# Patient Record
Sex: Male | Born: 1985 | Race: White | Hispanic: No | Marital: Married | Smoking: Never smoker
Health system: Southern US, Community
[De-identification: ages and names within clinical notes are randomized; demographics above are authoritative.]

## PROBLEM LIST (undated history)

## (undated) DIAGNOSIS — R112 Nausea with vomiting, unspecified: Secondary | ICD-10-CM

## (undated) DIAGNOSIS — G8929 Other chronic pain: Secondary | ICD-10-CM

## (undated) DIAGNOSIS — Z9889 Other specified postprocedural states: Secondary | ICD-10-CM

## (undated) DIAGNOSIS — I499 Cardiac arrhythmia, unspecified: Secondary | ICD-10-CM

## (undated) DIAGNOSIS — M549 Dorsalgia, unspecified: Secondary | ICD-10-CM

## (undated) HISTORY — PX: HERNIA REPAIR: SHX51

## (undated) HISTORY — PX: KNEE SURGERY: SHX244

---

## 2006-08-04 ENCOUNTER — Emergency Department: Payer: Self-pay | Admitting: Unknown Physician Specialty

## 2007-05-11 ENCOUNTER — Ambulatory Visit: Payer: Self-pay | Admitting: Emergency Medicine

## 2008-07-18 ENCOUNTER — Ambulatory Visit: Payer: Self-pay | Admitting: Family Medicine

## 2009-03-28 ENCOUNTER — Ambulatory Visit: Payer: Self-pay | Admitting: Family Medicine

## 2009-07-19 ENCOUNTER — Emergency Department: Payer: Self-pay | Admitting: Emergency Medicine

## 2009-07-22 ENCOUNTER — Ambulatory Visit: Payer: Self-pay | Admitting: Internal Medicine

## 2010-03-09 ENCOUNTER — Ambulatory Visit: Payer: Self-pay | Admitting: Internal Medicine

## 2011-10-29 ENCOUNTER — Encounter (HOSPITAL_COMMUNITY): Payer: Self-pay | Admitting: *Deleted

## 2011-10-29 ENCOUNTER — Emergency Department (HOSPITAL_COMMUNITY)
Admission: EM | Admit: 2011-10-29 | Discharge: 2011-10-29 | Disposition: A | Discharge status: Home / Self Care | Payer: BC Managed Care – PPO | Attending: Emergency Medicine | Admitting: Emergency Medicine

## 2011-10-29 ENCOUNTER — Emergency Department (HOSPITAL_COMMUNITY): Payer: BC Managed Care – PPO

## 2011-10-29 DIAGNOSIS — S0093XA Contusion of unspecified part of head, initial encounter: Secondary | ICD-10-CM

## 2011-10-29 DIAGNOSIS — S0003XA Contusion of scalp, initial encounter: Secondary | ICD-10-CM | POA: Insufficient documentation

## 2011-10-29 DIAGNOSIS — IMO0002 Reserved for concepts with insufficient information to code with codable children: Secondary | ICD-10-CM | POA: Insufficient documentation

## 2011-10-29 DIAGNOSIS — R51 Headache: Secondary | ICD-10-CM | POA: Insufficient documentation

## 2011-10-29 HISTORY — DX: Cardiac arrhythmia, unspecified: I49.9

## 2011-10-29 NOTE — ED Notes (Signed)
Pt was working with front end loader , sapling tree struck him in  Lt side  of head, No LOC, but says everything "went dark".  No nausea, or confusion.  Alert, at triage, denies neck pain,

## 2011-10-29 NOTE — ED Provider Notes (Cosign Needed)
History   This chart was scribed for Ward Givens, MD by Sofie Rower. The patient was seen in room APA01/APA01 and the patient's care was started at 7:49 PM     CSN: [id number]  Arrival date & time 10/29/11  1914   First MD Initiated Contact with Patient 10/29/11 1928      Chief Complaint  Patient presents with  . Head Injury    (Consider location/radiation/quality/duration/timing/severity/associated sxs/prior treatment) HPI  Michael Jenkins is a 26 y.o. male who presents to the Emergency Department complaining of head injury onset today with associated symptoms of headache. The pt was pushing trees with his front end loader, when one of the trees sprung back and hit him in the head on the left side. This happened about 3:30 pm. States he has headache at the site.   Pt denies LOC, nausea, vomiting, blurred vision, numbness in the arms or legs, difficulty walking  Pt does not have a PCP. Cardiologist is Dr. Darrold Junker at [facility] in [place], [place].    Past Medical History  Diagnosis Date  . Arrhythmia   Pt has a hx of arrhythmia, taking metoprolol.   Past Surgical History  Procedure Date  . Hernia repair       History  Substance Use Topics  . Smoking status: Never Smoker   . Smokeless tobacco: Not on file  . Alcohol Use: No   Pt does not smoke, does not drink.  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    10 Systems reviewed and all are negative for acute change except as noted in the HPI.    Allergies  Medrol  Home Medications   Current Outpatient Rx  Name Route Sig Dispense Refill  . LORATADINE 10 MG PO TABS Oral Take 10 mg by mouth daily.    Marland Kitchen METOPROLOL SUCCINATE ER 25 MG PO TB24 Oral Take 25 mg by mouth every evening.      BP 124/73  Pulse 73  Temp 98.3 F (36.8 C) (Oral)  Resp 20  Ht 5\' 7"  (1.702 m)  Wt 175 lb (79.379 kg)  BMI 27.41 kg/m2  SpO2 98%  Vital signs  normal    Physical Exam  Nursing note and vitals reviewed. Constitutional: He is oriented to person, place, and time. He appears well-developed and well-nourished.  HENT:  Head: Normocephalic.  Right Ear: External ear normal.  Left Ear: Tympanic membrane, external ear and ear canal normal.  Nose: Nose normal.  Mouth/Throat: Oropharynx is clear and moist.       Swelling, bruising just above left ear on the scalp, linear 6 cm X 2 cm, no laceration or abrasion   Eyes: Conjunctivae and EOM are normal. Right eye exhibits no discharge. Left eye exhibits no discharge.  Pulmonary/Chest: Effort normal. No respiratory distress.  Musculoskeletal: Normal range of motion. He exhibits no edema and no tenderness.  Neurological: He is alert and oriented to person, place, and time.  Skin: Skin is warm and dry. No rash noted.  Psychiatric: He has a normal mood and affect. His behavior is normal.    ED Course  Procedures (including critical care time)  DIAGNOSTIC STUDIES: Oxygen Saturation is 98% on room air, normal by my interpretation.    COORDINATION OF CARE:  7:52PM- EDP at bedside discusses treatment plan.   Pt remains neurologically intact in the ED   Ct Head Wo Contrast  10/29/2011  *RADIOLOGY  REPORT*  Clinical Data:  Trauma, head injury, headache  CT HEAD WITHOUT CONTRAST  Technique:  Contiguous axial images were obtained from the base of the skull through the vertex without contrast  Comparison:  None.  Findings:  The brain has a normal appearance without evidence for hemorrhage, acute infarction, hydrocephalus, or mass lesion.  There is no extra axial fluid collection.  The skull and paranasal sinuses are normal.  IMPRESSION: Normal CT of the head without contrast.  Original Report Authenticated By: Judie Petit. Ruel Favors, M.D.      1. Contusion of head     Plan discharge  Devoria Albe, MD, FACEP    MDM   I personally performed the services described in this documentation, which was  scribed in my presence. The recorded information has been reviewed and considered.  Devoria Albe, MD, FACEP    Ward Givens, MD 10/30/11 0005

## 2011-10-29 NOTE — Discharge Instructions (Signed)
Ice packs to the swollen area. You can safely take tylenol for pain 1000 mg every 6hrs. Return to the ED for any problems listed on the head injury sheet.

## 2012-06-10 ENCOUNTER — Ambulatory Visit: Payer: Self-pay | Admitting: Emergency Medicine

## 2012-06-10 LAB — RAPID STREP-A WITH REFLX: Micro Text Report: NEGATIVE

## 2013-07-30 ENCOUNTER — Ambulatory Visit: Payer: Self-pay | Admitting: Emergency Medicine

## 2013-08-05 ENCOUNTER — Emergency Department: Payer: Self-pay

## 2013-08-05 LAB — CBC WITH DIFFERENTIAL/PLATELET
Basophil #: 0.1 10*3/uL (ref 0.0–0.1)
Basophil %: 0.9 %
EOS PCT: 0.2 %
Eosinophil #: 0 10*3/uL (ref 0.0–0.7)
HCT: 51.8 % (ref 40.0–52.0)
HGB: 16.8 g/dL (ref 13.0–18.0)
Lymphocyte #: 1.6 10*3/uL (ref 1.0–3.6)
Lymphocyte %: 18 %
MCH: 28.8 pg (ref 26.0–34.0)
MCHC: 32.5 g/dL (ref 32.0–36.0)
MCV: 89 fL (ref 80–100)
Monocyte #: 0.5 x10 3/mm (ref 0.2–1.0)
Monocyte %: 6.1 %
NEUTROS ABS: 6.5 10*3/uL (ref 1.4–6.5)
Neutrophil %: 74.8 %
PLATELETS: 218 10*3/uL (ref 150–440)
RBC: 5.85 10*6/uL (ref 4.40–5.90)
RDW: 13.1 % (ref 11.5–14.5)
WBC: 8.7 10*3/uL (ref 3.8–10.6)

## 2013-08-05 LAB — BASIC METABOLIC PANEL
Anion Gap: 4 — ABNORMAL LOW (ref 7–16)
BUN: 12 mg/dL (ref 7–18)
CHLORIDE: 106 mmol/L (ref 98–107)
CO2: 29 mmol/L (ref 21–32)
Calcium, Total: 9.3 mg/dL (ref 8.5–10.1)
Creatinine: 1.04 mg/dL (ref 0.60–1.30)
EGFR (African American): >60
EGFR (Non-African Amer.): >60
GLUCOSE: 102 mg/dL — AB (ref 65–99)
OSMOLALITY: 277 (ref 275–301)
Potassium: 4.1 mmol/L (ref 3.5–5.1)
Sodium: 139 mmol/L (ref 136–145)

## 2013-08-05 LAB — TROPONIN I: Troponin-I: <0.02 ng/mL

## 2014-01-15 ENCOUNTER — Emergency Department (HOSPITAL_COMMUNITY)
Admission: EM | Admit: 2014-01-15 | Discharge: 2014-01-16 | Disposition: A | Discharge status: Home / Self Care | Payer: BC Managed Care – PPO | Attending: Emergency Medicine | Admitting: Emergency Medicine

## 2014-01-15 ENCOUNTER — Encounter (HOSPITAL_COMMUNITY): Payer: Self-pay | Admitting: Emergency Medicine

## 2014-01-15 DIAGNOSIS — K5289 Other specified noninfective gastroenteritis and colitis: Secondary | ICD-10-CM | POA: Insufficient documentation

## 2014-01-15 DIAGNOSIS — R112 Nausea with vomiting, unspecified: Secondary | ICD-10-CM | POA: Insufficient documentation

## 2014-01-15 DIAGNOSIS — K529 Noninfective gastroenteritis and colitis, unspecified: Secondary | ICD-10-CM

## 2014-01-15 DIAGNOSIS — Z79899 Other long term (current) drug therapy: Secondary | ICD-10-CM | POA: Diagnosis not present

## 2014-01-15 DIAGNOSIS — R197 Diarrhea, unspecified: Secondary | ICD-10-CM | POA: Insufficient documentation

## 2014-01-15 DIAGNOSIS — R109 Unspecified abdominal pain: Secondary | ICD-10-CM | POA: Diagnosis not present

## 2014-01-15 LAB — BASIC METABOLIC PANEL
Anion gap: 16 — ABNORMAL HIGH (ref 5–15)
BUN: 15 mg/dL (ref 6–23)
CALCIUM: 9.5 mg/dL (ref 8.4–10.5)
CO2: 24 mEq/L (ref 19–32)
CREATININE: 0.97 mg/dL (ref 0.50–1.35)
Chloride: 100 mEq/L (ref 96–112)
GFR calc Af Amer: >90 mL/min (ref >90)
GLUCOSE: 113 mg/dL — AB (ref 70–99)
POTASSIUM: 3.8 meq/L (ref 3.7–5.3)
Sodium: 140 mEq/L (ref 137–147)

## 2014-01-15 LAB — CBC WITH DIFFERENTIAL/PLATELET
Basophils Absolute: 0 10*3/uL (ref 0.0–0.1)
Basophils Relative: 0 % (ref 0–1)
EOS ABS: 0 10*3/uL (ref 0.0–0.7)
EOS PCT: 0 % (ref 0–5)
HEMATOCRIT: 49.9 % (ref 39.0–52.0)
HEMOGLOBIN: 17.4 g/dL — AB (ref 13.0–17.0)
LYMPHS ABS: 0.6 10*3/uL — AB (ref 0.7–4.0)
Lymphocytes Relative: 7 % — ABNORMAL LOW (ref 12–46)
MCH: 30.2 pg (ref 26.0–34.0)
MCHC: 34.9 g/dL (ref 30.0–36.0)
MCV: 86.6 fL (ref 78.0–100.0)
MONO ABS: 0.6 10*3/uL (ref 0.1–1.0)
MONOS PCT: 7 % (ref 3–12)
Neutro Abs: 6.9 10*3/uL (ref 1.7–7.7)
Neutrophils Relative %: 86 % — ABNORMAL HIGH (ref 43–77)
Platelets: 180 10*3/uL (ref 150–400)
RBC: 5.76 MIL/uL (ref 4.22–5.81)
RDW: 12.4 % (ref 11.5–15.5)
WBC: 8.1 10*3/uL (ref 4.0–10.5)

## 2014-01-15 MED ORDER — SODIUM CHLORIDE 0.9 % IV BOLUS (SEPSIS)
1000.0000 mL | Freq: Once | INTRAVENOUS | Status: AC
  Administered 2014-01-15: 1000 mL via INTRAVENOUS

## 2014-01-15 MED ORDER — ONDANSETRON HCL 4 MG/2ML IJ SOLN
4.0000 mg | Freq: Once | INTRAMUSCULAR | Status: AC
  Administered 2014-01-15: 4 mg via INTRAVENOUS
  Filled 2014-01-15: qty 2

## 2014-01-15 NOTE — ED Notes (Addendum)
Pt states he ate deer meat yesterday and has been having n/v/d ever since. Pt states he feels weak and his legs feel numb. Pt his back hurts from vomiting and his chest has been hurting since 2 pm today. Pt states it has been constant and dull.

## 2014-01-15 NOTE — ED Provider Notes (Signed)
CSN: [id number]     Arrival date & time 01/15/14  1931 History   First MD Initiated Contact with Patient 01/15/14 2205   This chart was scribed for Benny Lennert, MD by Gwenevere Abbot, ED scribe. This patient was seen in room APA17/APA17 and the patient's care was started at 10:07 PM.    Chief Complaint  Patient presents with  . Emesis   Patient is a 28 y.o. male presenting with vomiting. The history is provided by the patient. No language interpreter was used.  Emesis Severity:  Moderate Associated symptoms: abdominal pain and diarrhea   Associated symptoms: no headaches    HPI Comments:  Michael Jenkins is a 28 y.o. male who presents to the Emergency Department complaining of emesis. Pt reports that he has had approximately 7 episodes of emesis, with associated symptoms abdominal cramping and diarrhea, onset yesterday. Pt reports that he ate deer meat last night, that was mixed with beef. Pt reports that he had similar symptoms in February of this year after consuming the same food.   Past Medical History  Diagnosis Date  . Arrhythmia    Past Surgical History  Procedure Laterality Date  . Hernia repair     History reviewed. No pertinent family history. History  Substance Use Topics  . Smoking status: Never Smoker   . Smokeless tobacco: Not on file  . Alcohol Use: No    Review of Systems  Constitutional: Negative for appetite change and fatigue.  HENT: Negative for congestion, ear discharge and sinus pressure.   Eyes: Negative for discharge.  Respiratory: Negative for cough.   Cardiovascular: Negative for chest pain.  Gastrointestinal: Positive for nausea, vomiting, abdominal pain and diarrhea.  Genitourinary: Negative for frequency and hematuria.  Musculoskeletal: Negative for back pain.  Skin: Negative for rash.  Neurological: Negative for seizures and headaches.  Psychiatric/Behavioral: Negative for hallucinations.      Allergies  Medrol  Home Medications    Prior to Admission medications   Medication Sig Start Date End Date Taking? Authorizing Provider  metoprolol succinate (TOPROL-XL) 25 MG 24 hr tablet Take 25 mg by mouth every evening.   Yes Historical Provider, MD  loratadine (CLARITIN) 10 MG tablet Take 10 mg by mouth daily.    Historical Provider, MD   BP 116/69  Pulse 88  Temp(Src) 99.2 F (37.3 C) (Oral)  Resp 17  Ht  (1.702 m)  Wt 181 lb (82.101 kg)  BMI 28.34 kg/m2  SpO2 96% Physical Exam  Constitutional: He is oriented to person, place, and time. He appears well-developed.  HENT:  Head: Normocephalic.  Eyes: Conjunctivae and EOM are normal. No scleral icterus.  Neck: Neck supple. No thyromegaly present.  Cardiovascular: Normal rate and regular rhythm.  Exam reveals no gallop and no friction rub.   No murmur heard. Pulmonary/Chest: No stridor. He has no wheezes. He has no rales. He exhibits no tenderness.  Abdominal: He exhibits no distension. There is generalized tenderness. There is no rebound.  Musculoskeletal: Normal range of motion. He exhibits no edema.  Lymphadenopathy:    He has no cervical adenopathy.  Neurological: He is oriented to person, place, and time. He exhibits normal muscle tone. Coordination normal.  Skin: No rash noted. No erythema.  Psychiatric: He has a normal mood and affect. His behavior is normal.    ED Course  Procedures  DIAGNOSTIC STUDIES: Oxygen Saturation is 100% on RA, normal by my interpretation.  COORDINATION OF CARE: 10:10 PM-Discussed treatment plan  with pt  Labs Review Labs Reviewed  CBC WITH DIFFERENTIAL - Abnormal; Notable for the following:    Hemoglobin 17.4 (*)    Neutrophils Relative % 86 (*)    Lymphocytes Relative 7 (*)    Lymphs Abs 0.6 (*)    All other components within normal limits  BASIC METABOLIC PANEL - Abnormal; Notable for the following:    Glucose, Bld 113 (*)    Anion gap 16 (*)    All other components within normal limits    Imaging  Review No results found.   EKG Interpretation   Date/Time:  Friday January 15 2014 19:48:14 EDT Ventricular Rate:  97 PR Interval:  152 QRS Duration: 88 QT Interval:  334 QTC Calculation: 424 R Axis:   14 Text Interpretation:  Normal sinus rhythm Normal ECG Confirmed by POLLINA   MD, CHRISTOPHER [phone]) on 01/15/2014 8:54:39 PM      MDM   Final diagnoses:  None    Gastroenteritis  The chart was scribed for me under my direct supervision.  I personally performed the history, physical, and medical decision making and all procedures in the evaluation of this patient.Benny Lennert, MD 01/16/14 [phone]

## 2014-01-15 NOTE — ED Notes (Signed)
Patient states NVD  Since 0200 this morning. Patient AOX4 at this time.

## 2014-01-16 MED ORDER — ONDANSETRON 4 MG PO TBDP: ORAL_TABLET | ORAL | Status: DC

## 2014-01-16 NOTE — Discharge Instructions (Signed)
Tylenol or motrin for pain.  Plenty of fluids.  Follow up as needed

## 2014-09-23 ENCOUNTER — Other Ambulatory Visit: Payer: Self-pay | Admitting: Orthopedic Surgery

## 2014-09-23 DIAGNOSIS — M25561 Pain in right knee: Secondary | ICD-10-CM

## 2014-09-30 ENCOUNTER — Ambulatory Visit
Admission: RE | Admit: 2014-09-30 | Discharge: 2014-09-30 | Disposition: A | Discharge status: Home / Self Care | Payer: BLUE CROSS/BLUE SHIELD | Source: Ambulatory Visit | Attending: Orthopedic Surgery | Admitting: Orthopedic Surgery

## 2014-09-30 DIAGNOSIS — S83241A Other tear of medial meniscus, current injury, right knee, initial encounter: Secondary | ICD-10-CM | POA: Insufficient documentation

## 2014-09-30 DIAGNOSIS — M25561 Pain in right knee: Secondary | ICD-10-CM

## 2014-09-30 DIAGNOSIS — X58XXXA Exposure to other specified factors, initial encounter: Secondary | ICD-10-CM | POA: Diagnosis not present

## 2015-02-11 ENCOUNTER — Encounter: Payer: Self-pay | Admitting: Emergency Medicine

## 2015-02-11 ENCOUNTER — Emergency Department
Admission: EM | Admit: 2015-02-11 | Discharge: 2015-02-11 | Disposition: A | Discharge status: Home / Self Care | Payer: BLUE CROSS/BLUE SHIELD | Attending: Emergency Medicine | Admitting: Emergency Medicine

## 2015-02-11 DIAGNOSIS — Z79899 Other long term (current) drug therapy: Secondary | ICD-10-CM | POA: Diagnosis not present

## 2015-02-11 DIAGNOSIS — K644 Residual hemorrhoidal skin tags: Secondary | ICD-10-CM | POA: Insufficient documentation

## 2015-02-11 DIAGNOSIS — K625 Hemorrhage of anus and rectum: Secondary | ICD-10-CM | POA: Diagnosis present

## 2015-02-11 LAB — BASIC METABOLIC PANEL
Anion gap: 8 (ref 5–15)
BUN: 16 mg/dL (ref 6–20)
CHLORIDE: 105 mmol/L (ref 101–111)
CO2: 25 mmol/L (ref 22–32)
Calcium: 9 mg/dL (ref 8.9–10.3)
Creatinine, Ser: 1.1 mg/dL (ref 0.61–1.24)
GFR calc Af Amer: >60 mL/min (ref >60)
GFR calc non Af Amer: >60 mL/min (ref >60)
GLUCOSE: 144 mg/dL — AB (ref 65–99)
POTASSIUM: 3.7 mmol/L (ref 3.5–5.1)
Sodium: 138 mmol/L (ref 135–145)

## 2015-02-11 LAB — CBC WITH DIFFERENTIAL/PLATELET
Basophils Absolute: 0 10*3/uL (ref 0–0.1)
Basophils Relative: 1 %
EOS PCT: 1 %
Eosinophils Absolute: 0 10*3/uL (ref 0–0.7)
HCT: 47.3 % (ref 40.0–52.0)
HEMOGLOBIN: 16 g/dL (ref 13.0–18.0)
LYMPHS ABS: 1.4 10*3/uL (ref 1.0–3.6)
LYMPHS PCT: 30 %
MCH: 29.5 pg (ref 26.0–34.0)
MCHC: 33.8 g/dL (ref 32.0–36.0)
MCV: 87.3 fL (ref 80.0–100.0)
MONOS PCT: 10 %
Monocytes Absolute: 0.5 10*3/uL (ref 0.2–1.0)
Neutro Abs: 2.7 10*3/uL (ref 1.4–6.5)
Neutrophils Relative %: 58 %
PLATELETS: 172 10*3/uL (ref 150–440)
RBC: 5.42 MIL/uL (ref 4.40–5.90)
RDW: 12.8 % (ref 11.5–14.5)
WBC: 4.6 10*3/uL (ref 3.8–10.6)

## 2015-02-11 MED ORDER — HYDROCORTISONE 2.5 % RE CREA: TOPICAL_CREAM | RECTAL | Status: AC

## 2015-02-11 NOTE — ED Notes (Signed)
Pt to ed with c/o bloody stool x 1 this am and 2 days ago.  Denies abd pain, denies nausea, denies vomiting.

## 2015-02-11 NOTE — ED Provider Notes (Signed)
Michael Jenkins: [id number]     Arrival date & time 02/11/15  1252 History   First MD Initiated Contact with Patient 02/11/15 1257     Chief Complaint  Patient presents with  . Rectal Bleeding     (Consider location/radiation/quality/duration/timing/severity/associated sxs/prior Treatment) The history is provided by the patient.  Michael Jenkins is a 29 y.o. male here presenting with blood around his stool. States that he had let around his stool about 2 days ago. States that the stool appears normal but has blood around the stool. Denies being constipated. Denies any fever or vomiting or abdominal pain. Denies anal sex. Sexually active with his wife only. Denies urinary symptoms but does notice small bump on his penis but no urethral discharge.    Past Medical History  Diagnosis Date  . Arrhythmia    Past Surgical History  Procedure Laterality Date  . Hernia repair     History reviewed. No pertinent family history. Social History  Substance Use Topics  . Smoking status: Never Smoker   . Smokeless tobacco: None  . Alcohol Use: Yes    Review of Systems  Gastrointestinal: Positive for blood in stool and hematochezia.  All other systems reviewed and are negative.     Allergies  Medrol  Home Medications   Prior to Admission medications   Medication Sig Start Date End Date Taking? Authorizing Provider  hydrocortisone (ANUSOL-HC) 2.5 % rectal cream Apply rectally 2 times daily 02/11/15 02/11/16  Richardean Canal, MD  loratadine (CLARITIN) 10 MG tablet Take 10 mg by mouth daily.    Historical Provider, MD  metoprolol succinate (TOPROL-XL) 25 MG 24 hr tablet Take 25 mg by mouth every evening.    Historical Provider, MD  ondansetron (ZOFRAN ODT) 4 MG disintegrating tablet  ODT q4 hours prn nausea/vomit 01/16/14   Bethann Berkshire, MD   BP 149/92 mmHg  Pulse 76  Temp(Src) 97.7 F (36.5 C) (Oral)  Resp 20  Ht  (1.702 m)  Wt 180 lb (81.647 kg)  BMI 28.19 kg/m2  SpO2 98% Physical Exam   Constitutional: He is oriented to person, place, and time. He appears well-developed.  HENT:  Head: Normocephalic.  Mouth/Throat: Oropharynx is clear and moist.  Eyes: Conjunctivae are normal. Pupils are equal, round, and reactive to light.  Neck: Normal range of motion. Neck supple.  Cardiovascular: Normal rate, regular rhythm and normal heart sounds.   Pulmonary/Chest: Effort normal and breath sounds normal. No respiratory distress. He has no wheezes. He has no rales.  Abdominal: Soft. Bowel sounds are normal. He exhibits no distension. There is no tenderness. There is no rebound.  Genitourinary:  Small vesicle on penis, no penile discharge. Testicles nontender and no obvious mass. Rectal- small external hemorrhoid that is not thrombosed. Brown stool with blood around it, faintly guiac positive.   Neurological: He is alert and oriented to person, place, and time.  Skin: Skin is warm and dry.  Psychiatric: He has a normal mood and affect. His behavior is normal. Judgment and thought content normal.  Nursing note and vitals reviewed.   ED Course  Procedures (including critical care time) Labs Review Labs Reviewed  BASIC METABOLIC PANEL - Abnormal; Notable for the following:    Glucose, Bld 144 (*)    All other components within normal limits  CBC WITH DIFFERENTIAL/PLATELET    Imaging Review No results found. I have personally reviewed and evaluated these images and lab results as part of my medical decision-making.   EKG  Interpretation None      MDM   Final diagnoses:  Hemorrhoids, external    Michael Jenkins is a 29 y.o. male here with blood around stool likely external hemorrhoid. Hemorrhoid doesn't appear thrombosed. Small penile lesion, sexually active with only wife and no hx of STDs. No urinary symptoms. Will dc home with anusol cream.     Richardean Canal, MD 02/11/15 [phone]

## 2015-02-11 NOTE — Discharge Instructions (Signed)
Try anusol cream twice daily.  Use sitz bath (warm water) 2-3 times daily.  Avoid sitting too long or picking up heavy things.   See your doctor.   Return to ER if you have uncontrolled bleeding, severe pain, fever, vomiting.    Hemorrhoids Hemorrhoids are puffy (swollen) veins around the rectum or anus. Hemorrhoids can cause pain, itching, bleeding, or irritation. HOME CARE  Eat foods with fiber, such as whole grains, beans, nuts, fruits, and vegetables. Ask your doctor about taking products with added fiber in them (fibersupplements).  Drink enough fluid to keep your pee (urine) clear or pale yellow.  Exercise often.  Go to the bathroom when you have the urge to poop. Do not wait.  Avoid straining to poop (bowel movement).  Keep the butt area dry and clean. Use wet toilet paper or moist paper towels.  Medicated creams and medicine inserted into the anus (anal suppository) may be used or applied as told.  Only take medicine as told by your doctor.  Take a warm water bath (sitz bath) for 15-20 minutes to ease pain. Do this 3-4 times a day.  Place ice packs on the area if it is tender or puffy. Use the ice packs between the warm water baths.  Put ice in a plastic bag.  Place a towel between your skin and the bag.  Leave the ice on for 15-20 minutes, 03-04 times a day.  Do not use a donut-shaped pillow or sit on the toilet for a long time. GET HELP RIGHT AWAY IF:   You have more pain that is not controlled by treatment or medicine.  You have bleeding that will not stop.  You have trouble or are unable to poop (bowel movement).  You have pain or puffiness outside the area of the hemorrhoids. MAKE SURE YOU:   Understand these instructions.  Will watch your condition.  Will get help right away if you are not doing well or get worse.   This information is not intended to replace advice given to you by your health care provider. Make sure you discuss any  questions you have with your health care provider.   Document Released: 01/31/2008 Document Revised: 04/09/2012 Document Reviewed: 03/04/2012 Elsevier Interactive Patient Education [employer]

## 2015-02-14 ENCOUNTER — Encounter: Payer: Self-pay | Admitting: *Deleted

## 2015-02-14 ENCOUNTER — Ambulatory Visit
Admission: EM | Admit: 2015-02-14 | Discharge: 2015-02-14 | Disposition: A | Discharge status: Home / Self Care | Payer: BLUE CROSS/BLUE SHIELD | Attending: Family Medicine | Admitting: Family Medicine

## 2015-02-14 DIAGNOSIS — N489 Disorder of penis, unspecified: Secondary | ICD-10-CM

## 2015-02-14 DIAGNOSIS — N4889 Other specified disorders of penis: Secondary | ICD-10-CM

## 2015-02-14 MED ORDER — MUPIROCIN 2 % EX OINT: 1.0000 "application " | TOPICAL_OINTMENT | Freq: Three times a day (TID) | CUTANEOUS | Status: DC

## 2015-02-14 NOTE — ED Notes (Signed)
Patient started having symptoms of a hard spot growing on the left side of his penis about 1 month ago. Patient initially thought it was a insect bite from putting up hay. No drainage has been observed. The hard spot is raised and red with soreness.

## 2015-02-14 NOTE — ED Provider Notes (Signed)
CSN: [id number]     Arrival date & time 02/14/15  1032 History   First MD Initiated Contact with Patient 02/14/15 1220    Nurses notes were reviewed.  Chief Complaint  Patient presents with  . Groin Swelling   (Consider location/radiation/quality/duration/timing/severity/associated sxs/prior Treatment) Patient is a 29 y.o. male presenting with testicular pain. The history is provided by the patient. No language interpreter was used.  Testicle Pain This is a new (Penile pain not testicle pain) problem. The current episode started more than 1 week ago. The problem occurs constantly. The symptoms are aggravated by intercourse. Nothing relieves the symptoms. The treatment provided no relief.   Patient's here because of basically a nodule on the left shaft of his penis area and states several weeks ago he noticed a small knot. On the left shaft. The knot has grown has become more more irritated as it has gotten larger. Initially states that it did not stop him from having sexual relations and it still doesn't hamper him from having sexual relations but he was concerned because of the location and what this lesion could be. He denies sexual activity outside of his marriage and reports other when he was 17 his wife is only one person he's been sexually active with.   Past Medical History  Diagnosis Date  . Arrhythmia    Past Surgical History  Procedure Laterality Date  . Hernia repair     History reviewed. No pertinent family history. Social History  Substance Use Topics  . Smoking status: Never Smoker   . Smokeless tobacco: Never Used  . Alcohol Use: Yes    Review of Systems  Genitourinary: Positive for testicular pain.  All other systems reviewed and are negative.   Allergies  Medrol  Home Medications   Prior to Admission medications   Medication Sig Start Date End Date Taking? Authorizing Provider  loratadine (CLARITIN) 10 MG tablet Take 10 mg by mouth daily.   Yes Historical  Provider, MD  hydrocortisone (ANUSOL-HC) 2.5 % rectal cream Apply rectally 2 times daily 02/11/15 02/11/16  Richardean Canal, MD  metoprolol succinate (TOPROL-XL) 25 MG 24 hr tablet Take 25 mg by mouth every evening.    Historical Provider, MD  mupirocin ointment (BACTROBAN) 2 % Apply 1 application topically 3 (three) times daily. 02/14/15   Hassan Rowan, MD  ondansetron (ZOFRAN ODT) 4 MG disintegrating tablet  ODT q4 hours prn nausea/vomit 01/16/14   Bethann Berkshire, MD   Meds Ordered and Administered this Visit  Medications - No data to display  BP 122/79 mmHg  Pulse 67  Temp(Src) 98.1 F (36.7 C) (Oral)  Resp 18  Ht  (1.702 m)  Wt 185 lb (83.915 kg)  BMI 28.97 kg/m2  SpO2 100% No data found.   Physical Exam  Constitutional: He is oriented to person, place, and time. He appears well-developed and well-nourished.  HENT:  Head: Normocephalic and atraumatic.  Eyes: Pupils are equal, round, and reactive to light.  Neck: Normal range of motion. Neck supple.  Genitourinary: Testes normal.    Circumcised.  Small single nodule on the shaft of the penis on the left. The nodule looks irritated and red. No ulcerations present  Musculoskeletal: Normal range of motion. He exhibits no edema.  Neurological: He is alert and oriented to person, place, and time.  Skin: Skin is warm and dry.  Psychiatric: He has a normal mood and affect. His behavior is normal.  Vitals reviewed.   ED Course  Procedures (including critical care time)  Labs Review Labs Reviewed  RPR    Imaging Review No results found.   Visual Acuity Review  Right Eye Distance:   Left Eye Distance:   Bilateral Distance:    Right Eye Near:   Left Eye Near:    Bilateral Near:         MDM   1. Penile lesion     Explained to the patient I think this is a benign lesion. I think is not an STD or anything transmittable. Does not like again to wart or a symphysis granuloma. But unfortunately because of this  area receiving friction movement times this is probably caused the nodule to become irritated and grow. We'll get an RPR just to make sure the symphysis is not a factor will also place him back pain ointment 2-3 times a day to see if this makes it shrinker go away and in the meantime will obtain a urological consultation for evaluation and possible excision in 3-4 weeks if it's not shrinking. Patient seems to improve with this affects her have knee surgery in about 2-3 weeks so he stuck be able to go to work and elevated time for him to have his appointment preterm at the nodule is thinking of going away he can cancel the urological visit.  Hassan Rowan, MD 02/14/15 [phone]

## 2015-02-14 NOTE — Discharge Instructions (Signed)
Please apply Bactroban ointment to 3 times a day for the next 3-4 weeks. If the lesion is reducing in size then he may cancel the urological referral. But if it's not pleased to see to see the urologist for further evaluation and possible biopsy and removal of the lesion.

## 2015-02-15 LAB — RPR: RPR: NONREACTIVE

## 2015-04-23 ENCOUNTER — Emergency Department
Admission: EM | Admit: 2015-04-23 | Discharge: 2015-04-23 | Disposition: A | Discharge status: Home / Self Care | Payer: Worker's Compensation | Attending: Emergency Medicine | Admitting: Emergency Medicine

## 2015-04-23 ENCOUNTER — Encounter: Payer: Self-pay | Admitting: Emergency Medicine

## 2015-04-23 DIAGNOSIS — Z7721 Contact with and (suspected) exposure to potentially hazardous body fluids: Secondary | ICD-10-CM | POA: Diagnosis not present

## 2015-04-23 DIAGNOSIS — Z7952 Long term (current) use of systemic steroids: Secondary | ICD-10-CM | POA: Insufficient documentation

## 2015-04-23 DIAGNOSIS — Z792 Long term (current) use of antibiotics: Secondary | ICD-10-CM | POA: Insufficient documentation

## 2015-04-23 DIAGNOSIS — Z79899 Other long term (current) drug therapy: Secondary | ICD-10-CM | POA: Diagnosis not present

## 2015-04-23 HISTORY — DX: Cardiac arrhythmia, unspecified: I49.9

## 2015-04-23 LAB — CBC WITH DIFFERENTIAL/PLATELET
BASOS ABS: 0.2 10*3/uL — AB (ref 0–0.1)
BASOS PCT: 3 %
EOS ABS: 0.1 10*3/uL (ref 0–0.7)
Eosinophils Relative: 1 %
HEMATOCRIT: 50.1 % (ref 40.0–52.0)
HEMOGLOBIN: 16.6 g/dL (ref 13.0–18.0)
Lymphocytes Relative: 28 %
Lymphs Abs: 2 10*3/uL (ref 1.0–3.6)
MCH: 29.5 pg (ref 26.0–34.0)
MCHC: 33.1 g/dL (ref 32.0–36.0)
MCV: 89.2 fL (ref 80.0–100.0)
Monocytes Absolute: 0.4 10*3/uL (ref 0.2–1.0)
Monocytes Relative: 6 %
NEUTROS ABS: 4.5 10*3/uL (ref 1.4–6.5)
NEUTROS PCT: 62 %
Platelets: 196 10*3/uL (ref 150–440)
RBC: 5.61 MIL/uL (ref 4.40–5.90)
RDW: 12.7 % (ref 11.5–14.5)
WBC: 7.2 10*3/uL (ref 3.8–10.6)

## 2015-04-23 LAB — COMPREHENSIVE METABOLIC PANEL
ALK PHOS: 70 U/L (ref 38–126)
ALT: 104 U/L — AB (ref 17–63)
ANION GAP: 6 (ref 5–15)
AST: 41 U/L (ref 15–41)
Albumin: 5 g/dL (ref 3.5–5.0)
BUN: 12 mg/dL (ref 6–20)
CALCIUM: 9.6 mg/dL (ref 8.9–10.3)
CO2: 30 mmol/L (ref 22–32)
CREATININE: 0.93 mg/dL (ref 0.61–1.24)
Chloride: 105 mmol/L (ref 101–111)
Glucose, Bld: 91 mg/dL (ref 65–99)
Potassium: 4.1 mmol/L (ref 3.5–5.1)
SODIUM: 141 mmol/L (ref 135–145)
Total Bilirubin: 1 mg/dL (ref 0.3–1.2)
Total Protein: 7.5 g/dL (ref 6.5–8.1)

## 2015-04-23 MED ORDER — RALTEGRAVIR POTASSIUM 400 MG PO TABS
400.0000 mg | ORAL_TABLET | Freq: Two times a day (BID) | ORAL | Status: DC
  Administered 2015-04-23: 400 mg via ORAL
  Filled 2015-04-23: qty 1

## 2015-04-23 MED ORDER — EMTRICITABINE-TENOFOVIR DF 200-300 MG PO TABS
1.0000 | ORAL_TABLET | Freq: Every day | ORAL | Status: DC
  Administered 2015-04-23: 1 via ORAL
  Filled 2015-04-23: qty 1
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
## 2015-04-23 NOTE — ED Notes (Signed)
Spoke with Supervisor and they state pt does not need urine drug screen at this time.

## 2015-04-23 NOTE — ED Notes (Signed)
Medication dispensed from pharmacy for a 3 day supply. Pt instructed to come back to pharmacy on Tuesday for rest of medication supply. Pt instructed to call tomorrow or come back for results of other pt's HIV results.
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
## 2015-08-21 ENCOUNTER — Ambulatory Visit
Admission: EM | Admit: 2015-08-21 | Discharge: 2015-08-21 | Disposition: A | Discharge status: Home / Self Care | Payer: BLUE CROSS/BLUE SHIELD | Attending: Family Medicine | Admitting: Family Medicine

## 2015-08-21 ENCOUNTER — Encounter: Payer: Self-pay | Admitting: *Deleted

## 2015-08-21 DIAGNOSIS — J069 Acute upper respiratory infection, unspecified: Secondary | ICD-10-CM

## 2015-08-21 DIAGNOSIS — J018 Other acute sinusitis: Secondary | ICD-10-CM

## 2015-08-21 MED ORDER — AMOXICILLIN-POT CLAVULANATE 875-125 MG PO TABS
1.0000 | ORAL_TABLET | Freq: Two times a day (BID) | ORAL | Status: DC
Start: 2015-08-21 — End: 2016-03-02

## 2015-08-21 NOTE — ED Provider Notes (Signed)
CSN: [id number]     Arrival date & time 08/21/15  1433 History   First MD Initiated Contact with Patient 08/21/15 1559     Chief Complaint  Patient presents with  . Cough  . Nasal Congestion   (Consider location/radiation/quality/duration/timing/severity/associated sxs/prior Treatment) HPI: Patient presents today with symptoms of cough, congestion. Patient states that he's had symptoms since Wednesday. He states that he's had colored mucus now when coughing and with blowing his nose. He has been taking allergy medication such as Claritin and Flonase. He denies any myalgias or high fever. He denies any chest pain or shortness of breath.  Past Medical History  Diagnosis Date  . Arrhythmia   . Irregular heartbeat    Past Surgical History  Procedure Laterality Date  . Hernia repair     No family history on file. Social History  Substance Use Topics  . Smoking status: Never Smoker   . Smokeless tobacco: Never Used  . Alcohol Use: Yes    Review of Systems: Negative except mentioned above.  Allergies  Medrol  Home Medications   Prior to Admission medications   Medication Sig Start Date End Date Taking? Authorizing Provider  amoxicillin-clavulanate (AUGMENTIN) 875-125 MG tablet Take 1 tablet by mouth every 12 (twelve) hours. 08/21/15   Jolene ProvostKirtida Tawanda Schall, MD  hydrocortisone (ANUSOL-HC) 2.5 % rectal cream Apply rectally 2 times daily 02/11/15 02/11/16  Richardean Canalavid H Yao, MD  loratadine (CLARITIN) 10 MG tablet Take 10 mg by mouth daily.    Historical Provider, MD  metoprolol succinate (TOPROL-XL) 25 MG 24 hr tablet Take 25 mg by mouth every evening.    Historical Provider, MD  mupirocin ointment (BACTROBAN) 2 % Apply 1 application topically 3 (three) times daily. 02/14/15   Hassan RowanEugene Wade, MD  ondansetron (ZOFRAN ODT) 4 MG disintegrating tablet 4mg  ODT q4 hours prn nausea/vomit 01/16/14   Bethann BerkshireJoseph Zammit, MD   Meds Ordered and Administered this Visit  Medications - No data to display  BP 119/78 mmHg   Pulse 92  Temp(Src) 98.2 F (36.8 C) (Oral)  Ht 5\' 7"  (1.702 m)  Wt 190 lb (86.183 kg)  BMI 29.75 kg/m2  SpO2 100% No data found.   Physical Exam  GENERAL: NAD HEENT: mild pharyngeal erythema, no exudate, no erythema of TMs, no cervical LAD RESP: CTA B CARD: RRR NEURO: CN II-XII grossly intact   ED Course  Procedures (including critical care time)  Labs Review Labs Reviewed - No data to display  Imaging Review No results found.    MDM   1. Other acute sinusitis   2. URI, acute   Patient given Augmentin, continue antihistamine and nasal steroid, Tylenol/Motrin when necessary, rest, hydration, seek medical attention if symptoms persist or worsen.    Jolene ProvostKirtida Kathren Scearce, MD 08/21/15 [phone]

## 2015-08-21 NOTE — ED Notes (Signed)
Pt states that he has cough and congestion that started on Wednesday.

## 2015-11-19 ENCOUNTER — Encounter (HOSPITAL_COMMUNITY): Payer: Self-pay | Admitting: Emergency Medicine

## 2015-11-19 ENCOUNTER — Emergency Department (HOSPITAL_COMMUNITY)
Admission: EM | Admit: 2015-11-19 | Discharge: 2015-11-19 | Disposition: A | Discharge status: Home / Self Care | Payer: BLUE CROSS/BLUE SHIELD | Attending: Emergency Medicine | Admitting: Emergency Medicine

## 2015-11-19 DIAGNOSIS — S30861A Insect bite (nonvenomous) of abdominal wall, initial encounter: Secondary | ICD-10-CM | POA: Diagnosis not present

## 2015-11-19 DIAGNOSIS — Y939 Activity, unspecified: Secondary | ICD-10-CM | POA: Diagnosis not present

## 2015-11-19 DIAGNOSIS — Y999 Unspecified external cause status: Secondary | ICD-10-CM | POA: Insufficient documentation

## 2015-11-19 DIAGNOSIS — Y929 Unspecified place or not applicable: Secondary | ICD-10-CM | POA: Insufficient documentation

## 2015-11-19 DIAGNOSIS — M545 Low back pain, unspecified: Secondary | ICD-10-CM

## 2015-11-19 DIAGNOSIS — W57XXXA Bitten or stung by nonvenomous insect and other nonvenomous arthropods, initial encounter: Secondary | ICD-10-CM | POA: Diagnosis not present

## 2015-11-19 HISTORY — DX: Dorsalgia, unspecified: M54.9

## 2015-11-19 HISTORY — DX: Other chronic pain: G89.29

## 2015-11-19 MED ORDER — IBUPROFEN 800 MG PO TABS: 800.0000 mg | ORAL_TABLET | Freq: Three times a day (TID) | ORAL | Status: DC

## 2015-11-19 MED ORDER — IBUPROFEN 800 MG PO TABS
800.0000 mg | ORAL_TABLET | Freq: Once | ORAL | Status: AC
  Administered 2015-11-19: 800 mg via ORAL
  Filled 2015-11-19: qty 1

## 2015-11-19 MED ORDER — CYCLOBENZAPRINE HCL 10 MG PO TABS: 10.0000 mg | ORAL_TABLET | Freq: Three times a day (TID) | ORAL | Status: DC | PRN

## 2015-11-19 MED ORDER — DOXYCYCLINE HYCLATE 100 MG PO CAPS: 100.0000 mg | ORAL_CAPSULE | Freq: Two times a day (BID) | ORAL | Status: DC

## 2015-11-19 MED ORDER — CYCLOBENZAPRINE HCL 10 MG PO TABS
10.0000 mg | ORAL_TABLET | Freq: Once | ORAL | Status: AC
  Administered 2015-11-19: 10 mg via ORAL
  Filled 2015-11-19: qty 1

## 2015-11-19 MED ORDER — HYDROCODONE-ACETAMINOPHEN 5-325 MG PO TABS: ORAL_TABLET | ORAL | Status: DC

## 2015-11-19 NOTE — ED Notes (Signed)
Patient verbalizes understanding of discharge instructions, prescriptions, home care and follow up care. Patient out of department at this time. 

## 2015-11-19 NOTE — Discharge Instructions (Signed)
Tick Bite Information Ticks are insects that attach themselves to the skin and draw blood for food. There are various types of ticks. Common types include wood ticks and deer ticks. Most ticks live in shrubs and grassy areas. Ticks can climb onto your body when you make contact with leaves or grass where the tick is waiting. The most common places on the body for ticks to attach themselves are the scalp, neck, armpits, waist, and groin. Most tick bites are harmless, but sometimes ticks carry germs that cause diseases. These germs can be spread to a person during the tick's feeding process. The chance of a disease spreading through a tick bite depends on:   The type of tick.  Time of year.   How long the tick is attached.   Geographic location.  HOW CAN YOU PREVENT TICK BITES? Take these steps to help prevent tick bites when you are outdoors:  Wear protective clothing. Long sleeves and long pants are best.   Wear white clothes so you can see ticks more easily.  Tuck your pant legs into your socks.   If walking on a trail, stay in the middle of the trail to avoid brushing against bushes.  Avoid walking through areas with long grass.  Put insect repellent on all exposed skin and along boot tops, pant legs, and sleeve cuffs.   Check clothing, hair, and skin repeatedly and before going inside.   Brush off any ticks that are not attached.  Take a shower or bath as soon as possible after being outdoors.  WHAT IS THE PROPER WAY TO REMOVE A TICK? Ticks should be removed as soon as possible to help prevent diseases caused by tick bites. 1. If latex gloves are available, put them on before trying to remove a tick.  2. Using fine-point tweezers, grasp the tick as close to the skin as possible. You may also use curved forceps or a tick removal tool. Grasp the tick as close to its head as possible. Avoid grasping the tick on its body. 3. Pull gently with steady upward pressure until  the tick lets go. Do not twist the tick or jerk it suddenly. This may break off the tick's head or mouth parts. 4. Do not squeeze or crush the tick's body. This could force disease-carrying fluids from the tick into your body.  5. After the tick is removed, wash the bite area and your hands with soap and water or other disinfectant such as alcohol. 6. Apply a small amount of antiseptic cream or ointment to the bite site.  7. Wash and disinfect any instruments that were used.  Do not try to remove a tick by applying a hot match, petroleum jelly, or fingernail polish to the tick. These methods do not work and may increase the chances of disease being spread from the tick bite.  WHEN SHOULD YOU SEEK MEDICAL CARE? Contact your health care provider if you are unable to remove a tick from your skin or if a part of the tick breaks off and is stuck in the skin.  After a tick bite, you need to be aware of signs and symptoms that could be related to diseases spread by ticks. Contact your health care provider if you develop any of the following in the days or weeks after the tick bite:  Unexplained fever.  Rash. A circular rash that appears days or weeks after the tick bite may indicate the possibility of Lyme disease. The rash may resemble   a target with a bull's-eye and may occur at a different part of your body than the tick bite.  Redness and swelling in the area of the tick bite.   Tender, swollen lymph glands.   Diarrhea.   Weight loss.   Cough.   Fatigue.   Muscle, joint, or bone pain.   Abdominal pain.   Headache.   Lethargy or a change in your level of consciousness.  Difficulty walking or moving your legs.   Numbness in the legs.   Paralysis.  Shortness of breath.   Confusion.   Repeated vomiting.    This information is not intended to replace advice given to you by your health care provider. Make sure you discuss any questions you have with your health  care provider.   Document Released: 04/20/2000 Document Revised: 05/14/2014 Document Reviewed: 10/01/2012 Elsevier Interactive Patient Education 2016 [employer]  

## 2015-11-19 NOTE — ED Notes (Signed)
Patient c/o lower back pain. Per patient has chronic back pain but pain progressively worse this morning. Denies any injury. Denies any complications with urination or BM. Patient also c/o abscess in pubic region. Denies any drainage or fevers.

## 2015-11-22 NOTE — ED Provider Notes (Signed)
CSN: [id number]     Arrival date & time 11/19/15  1814 History   First MD Initiated Contact with Patient 11/19/15 1834     Chief Complaint  Patient presents with  . Back Pain     (Consider location/radiation/quality/duration/timing/severity/associated sxs/prior Treatment) HPI  SHERMAN DONALDSON is a 30 y.o. male who presents to the Emergency Department complaining of acute on chronic low back pain that has gradually worsening throughout the day.  He describes an aching pain across his lower back.  Pain worse with movement, improves at rest.  He denies injury, fever, chills, numbness, weakness or pain to the LE's, urine or bowel changes.  He also complains of "red bump" to the middle of his pubic area that he states has been present for several days.     Past Medical History  Diagnosis Date  . Arrhythmia   . Irregular heartbeat   . Chronic back pain    Past Surgical History  Procedure Laterality Date  . Hernia repair     History reviewed. No pertinent family history. Social History  Substance Use Topics  . Smoking status: Never Smoker   . Smokeless tobacco: Never Used  . Alcohol Use: Yes     Comment: rare    Review of Systems  Constitutional: Negative for fever.  Respiratory: Negative for shortness of breath.   Gastrointestinal: Negative for vomiting, abdominal pain and constipation.  Genitourinary: Negative for dysuria, hematuria, flank pain, decreased urine volume and difficulty urinating.  Musculoskeletal: Positive for back pain. Negative for joint swelling.  Skin: Negative for rash.       Red bump to pubic area  Neurological: Negative for weakness and numbness.  All other systems reviewed and are negative.     Allergies  Medrol  Home Medications   Prior to Admission medications   Medication Sig Start Date End Date Taking? Authorizing Provider  amoxicillin-clavulanate (AUGMENTIN) 875-125 MG tablet Take 1 tablet by mouth every 12 (twelve) hours. Patient not  taking: Reported on 11/19/2015 08/21/15   Jolene Provost, MD  cyclobenzaprine (FLEXERIL) 10 MG tablet Take 1 tablet (10 mg total) by mouth 3 (three) times daily as needed. 11/19/15   Oris Staffieri, PA-C  doxycycline (VIBRAMYCIN) 100 MG capsule Take 1 capsule (100 mg total) by mouth 2 (two) times daily. 11/19/15   Sayge Brienza, PA-C  HYDROcodone-acetaminophen (NORCO/VICODIN) 5-325 MG tablet Take one tab po q 4-6 hrs prn pain 11/19/15   Brittley Regner, PA-C  hydrocortisone (ANUSOL-HC) 2.5 % rectal cream Apply rectally 2 times daily Patient not taking: Reported on 11/19/2015 02/11/15 02/11/16  Charlynne Pander, MD  ibuprofen (ADVIL,MOTRIN) 800 MG tablet Take 1 tablet (800 mg total) by mouth 3 (three) times daily. 11/19/15   Rangel Echeverri, PA-C   BP 141/73 mmHg  Pulse 98  Temp(Src) 98.8 F (37.1 C) (Oral)  Resp 18  Ht  (1.702 m)  Wt 86.183 kg  BMI 29.75 kg/m2  SpO2 100% Physical Exam  Constitutional: He is oriented to person, place, and time. He appears well-developed and well-nourished. No distress.  HENT:  Head: Normocephalic and atraumatic.  Neck: Normal range of motion. Neck supple.  Cardiovascular: Normal rate, regular rhythm, normal heart sounds and intact distal pulses.   No murmur heard. Pulmonary/Chest: Effort normal and breath sounds normal. No respiratory distress.  Abdominal: Soft. He exhibits no distension. There is no tenderness.  Musculoskeletal: He exhibits tenderness. He exhibits no edema.       Lumbar back: He exhibits tenderness and  pain. He exhibits normal range of motion, no swelling, no deformity, no laceration and normal pulse.  ttp of the bilateral lumbar paraspinal muscles.  No spinal tenderness.  DP pulses are brisk and symmetrical.  Distal sensation intact.  Pt has 5/5 strength against resistance of bilateral lower extremities.     Neurological: He is alert and oriented to person, place, and time. He has normal strength. No sensory deficit. He exhibits normal  muscle tone. Coordination and gait normal.  Reflex Scores:      Patellar reflexes are 2+ on the right side and 2+ on the left side.      Achilles reflexes are 2+ on the right side and 2+ on the left side. Skin: Skin is warm and dry. No rash noted.  < 2 cm pustule to the mid pubic region.  Mild surrounding erythema.  No induration  Nursing note and vitals reviewed.   ED Course  Procedures (including critical care time) Labs Review Labs Reviewed - No data to display  Imaging Review No results found. I have personally reviewed and evaluated these images and lab results as part of my medical decision-making.   EKG Interpretation None      MDM   Final diagnoses:  Bilateral low back pain without sciatica  Insect bite   Pt well appearing, ambulates with steady gait.  No concerning sx's for emergent neuro process.  Likely acute on chronic pain to the lower back.    Small pustule to the pubic region.  I&D not indicated.    Pt stable for d/c.  Return precautions given    Pauline Ausammy Ashyr Hedgepath, PA-C 11/22/15 2208  Donnetta HutchingBrian Cook, MD 11/24/15 [id number]

## 2016-03-02 ENCOUNTER — Ambulatory Visit
Admission: EM | Admit: 2016-03-02 | Discharge: 2016-03-02 | Disposition: A | Discharge status: Home / Self Care | Payer: BLUE CROSS/BLUE SHIELD | Attending: Family Medicine | Admitting: Family Medicine

## 2016-03-02 DIAGNOSIS — R21 Rash and other nonspecific skin eruption: Secondary | ICD-10-CM

## 2016-03-02 DIAGNOSIS — B088 Other specified viral infections characterized by skin and mucous membrane lesions: Secondary | ICD-10-CM

## 2016-03-02 LAB — RAPID STREP SCREEN (MED CTR MEBANE ONLY): Streptococcus, Group A Screen (Direct): NEGATIVE

## 2016-03-02 NOTE — ED Provider Notes (Signed)
[facility]    CSN: [id number] Arrival date & time: 03/02/16  0854     History   Chief Complaint Chief Complaint  Patient presents with  . Rash    HPI Michael Jenkins is a 30 y.o. male.   Patient symptoms or rash. He states that Monday he started feeling achy all over running a low-grade fever as well. The achiness and the general malaise is gotten a little bit better but now is without rash on his hands and feet and his rash become very prominent. States the rash started on his hands is spread to his arms he's had a few on his neck none in his mouth but is also has emerged on his feet as well. States over the weekend his 57-month-old daughter went to a birthday party last week and subsequently developed a rash and was diagnosed by her pediatrician with hands and mouth disease. Reports allergies to Medrol. He has a history of an arrhythmia, anxiety and hernia surgery repair. He does not smoke and other than his daughter having hands and mouth disease no pertinent family medical history pertinent for today's visit   The history is provided by the patient. No language interpreter was used.  Rash  Location:  Hand, head/neck, torso and foot Severity:  Moderate Onset quality:  Sudden Timing:  Constant Progression:  Worsening Chronicity:  New Context: sick contacts   Relieved by:  Nothing Worsened by:  Nothing Associated symptoms: fever and sore throat     Past Medical History:  Diagnosis Date  . Arrhythmia   . Chronic back pain   . Irregular heartbeat     There are no active problems to display for this patient.   Past Surgical History:  Procedure Laterality Date  . HERNIA REPAIR         Home Medications    Prior to Admission medications   Not on File    Family History History reviewed. No pertinent family history.  Social History Social History  Substance Use Topics  . Smoking status: Never Smoker  . Smokeless tobacco: Never Used  . Alcohol  use Yes     Comment: rare     Allergies   Medrol [methylprednisolone]   Review of Systems Review of Systems  Constitutional: Positive for fever.  HENT: Positive for sore throat.   Skin: Positive for rash.  All other systems reviewed and are negative.    Physical Exam Triage Vital Signs ED Triage Vitals  Enc Vitals Group     BP 03/02/16 0907 126/86     Pulse Rate 03/02/16 0907 87     Resp 03/02/16 0907 18     Temp 03/02/16 0907 98.7 F (37.1 C)     Temp Source 03/02/16 0907 Oral     SpO2 03/02/16 0907 98 %     Weight 03/02/16 0908 190 lb (86.2 kg)     Height 03/02/16 0908 5\' 7"  (1.702 m)     Head Circumference --      Peak Flow --      Pain Score 03/02/16 0909 4     Pain Loc --      Pain Edu? --      Excl. in GC? --    No data found.   Updated Vital Signs BP 126/86 (BP Location: Left Arm)   Pulse 87   Temp 98.7 F (37.1 C) (Oral)   Resp 18   Ht 5\' 7"  (1.702 m)   Wt 190 lb (  86.2 kg)   SpO2 98%   BMI 29.76 kg/m   Visual Acuity Right Eye Distance:   Left Eye Distance:   Bilateral Distance:    Right Eye Near:   Left Eye Near:    Bilateral Near:     Physical Exam  Constitutional: He appears well-developed and well-nourished.  HENT:  Head: Normocephalic and atraumatic.  Right Ear: Hearing, tympanic membrane, external ear and ear canal normal.  Left Ear: Hearing, tympanic membrane, external ear and ear canal normal.  Nose: Rhinorrhea present. No mucosal edema. Right sinus exhibits no maxillary sinus tenderness and no frontal sinus tenderness. Left sinus exhibits no frontal sinus tenderness.  Mouth/Throat: Uvula is midline and oropharynx is clear and moist. No oral lesions. No uvula swelling.  Eyes: Pupils are equal, round, and reactive to light.  Neck: Normal range of motion.  Pulmonary/Chest: Breath sounds normal.  Musculoskeletal: Normal range of motion.  Neurological: He is alert.  Skin: Rash noted. There is erythema.     He has small petechial  like lesions consistent with hands mouth disease.  Psychiatric: He has a normal mood and affect.  Vitals reviewed.    UC Treatments / Results  Labs (all labs ordered are listed, but only abnormal results are displayed) Labs Reviewed  RAPID STREP SCREEN (NOT AT [facility])  CULTURE, GROUP A STREP [facility])    EKG  EKG Interpretation None       Radiology No results found.  Procedures Procedures (including critical care time)  Medications Ordered in UC Medications - No data to display   Initial Impression / Assessment and Plan / UC Course  I have reviewed the triage vital signs and the nursing notes.  Pertinent labs & imaging results that were available during my care of the patient were reviewed by me and considered in my medical decision making (see chart for details). Results for orders placed or performed during the hospital encounter of 03/02/16  Rapid strep screen  Result Value Ref Range   Streptococcus, Group A Screen (Direct) NEGATIVE NEGATIVE   Clinical Course  Because of the sore throat he had earlier strep test was obtained which was negative. Diagnosis of foot-and-mouth disease was made and felt a popping That.. Will give him a work note for Friday Saturday and Sunday and then return to work on Monday  Final Clinical Impressions(s) / UC Diagnoses   Final diagnoses:  Foot-and-mouth disease  Rash and nonspecific skin eruption    New Prescriptions New Prescriptions   No medications on file     Note: This dictation was prepared with Dragon dictation along with smaller phrase technology. Any transcriptional errors that result from this process are unintentional.   Hassan RowanEugene Larose Batres, MD 03/02/16 1115

## 2016-03-02 NOTE — ED Triage Notes (Signed)
Pt c/o of having a fever on Monday and body aches. He has blisters on his hands, feets, and a few on face. He states one of his children has hand/foot and mouth right now.

## 2016-03-04 LAB — CULTURE, GROUP A STREP (THRC)

## 2016-03-05 ENCOUNTER — Telehealth: Payer: Self-pay | Admitting: Emergency Medicine

## 2016-03-05 ENCOUNTER — Telehealth: Payer: Self-pay

## 2016-03-05 NOTE — Telephone Encounter (Signed)
-----   Message from Hassan RowanEugene Wade, MD sent at 03/04/2016  3:30 PM EDT ----- Please notify the patient that his strep culture was negative.

## 2016-03-05 NOTE — Telephone Encounter (Signed)
Patient call back and was notified that his throat culture was Negative.  Patient states that he still has soreness in his throat and neck.  Patient was advised that if his symptoms are not improving or worsening that he should follow-up here or with his PCP for further evaluation.  Patient verbalized understanding.

## 2016-03-05 NOTE — Telephone Encounter (Signed)
Courtesy call back completed today for patients recent visit at [facility]. Patient did not answer, left message on voicemail to call back with any questions or concerns.   

## 2016-05-05 ENCOUNTER — Ambulatory Visit (INDEPENDENT_AMBULATORY_CARE_PROVIDER_SITE_OTHER): Payer: BLUE CROSS/BLUE SHIELD

## 2016-05-05 ENCOUNTER — Encounter: Payer: Self-pay | Admitting: Emergency Medicine

## 2016-05-05 ENCOUNTER — Ambulatory Visit
Admission: EM | Admit: 2016-05-05 | Discharge: 2016-05-05 | Disposition: A | Discharge status: Home / Self Care | Payer: BLUE CROSS/BLUE SHIELD | Attending: Family Medicine | Admitting: Family Medicine

## 2016-05-05 DIAGNOSIS — J40 Bronchitis, not specified as acute or chronic: Secondary | ICD-10-CM | POA: Diagnosis not present

## 2016-05-05 DIAGNOSIS — J189 Pneumonia, unspecified organism: Secondary | ICD-10-CM | POA: Diagnosis not present

## 2016-05-05 DIAGNOSIS — R059 Cough, unspecified: Secondary | ICD-10-CM

## 2016-05-05 DIAGNOSIS — R05 Cough: Secondary | ICD-10-CM

## 2016-05-05 DIAGNOSIS — R0981 Nasal congestion: Secondary | ICD-10-CM | POA: Diagnosis not present

## 2016-05-05 LAB — RAPID INFLUENZA A&B ANTIGENS: Influenza B (ARMC): NEGATIVE

## 2016-05-05 LAB — RAPID INFLUENZA A&B ANTIGENS (ARMC ONLY): INFLUENZA A (ARMC): NEGATIVE

## 2016-05-05 MED ORDER — AZITHROMYCIN 500 MG PO TABS: 500.0000 mg | ORAL_TABLET | Freq: Every day | ORAL | 0 refills | Status: AC

## 2016-05-05 NOTE — ED Provider Notes (Signed)
CSN: [id number]     Arrival date & time 05/05/16  0830 History   First MD Initiated Contact with Patient 05/05/16 [phone]     Chief Complaint  Patient presents with  . Cough  . 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 Patient reports that his symptoms never completely went away. Patient reports fatigue, chills, congestion, ear pain, runny nose, sinus pressure/pain, sneezing, coughing, and body aches. Patient states that it "feels like I got hit by a truck".  Patient has tried over-the-counter Afrin and Sudafed for his congestion with no relief. [facility] and was given amoxicillin with no improvement. Patient has had positive sick exposure at work and at home. Patient is requesting for chest x-ray.      Past Medical History:  Diagnosis Date  . Arrhythmia   . Chronic back pain   . Irregular heartbeat    Past Surgical History:  Procedure Laterality Date  . HERNIA REPAIR     History reviewed. No pertinent family history. Social History  Substance Use Topics  . Smoking status: Never Smoker  . Smokeless tobacco: Never Used  . Alcohol use Yes     Comment: rare    Review of Systems  Constitutional: Positive for chills and fatigue. Negative for fever.  HENT: Positive for congestion, ear pain, rhinorrhea, sinus pain, sinus pressure and sneezing. Negative for sore throat.   Respiratory: Positive for cough. Negative for shortness of breath and wheezing.        SOB at times  Cardiovascular: Negative for chest pain and palpitations.  Gastrointestinal: Negative for abdominal pain, nausea and vomiting.  Musculoskeletal: Positive for myalgias.  Skin: Negative for rash.  Neurological: Negative for dizziness and headaches.    Allergies  Medrol  [methylprednisolone]  Home Medications   Prior to Admission medications   Medication Sig Start Date End Date Taking? Authorizing Provider  azithromycin (ZITHROMAX) 500 MG tablet Take 1 tablet (500 mg total) by mouth daily. 05/05/16 05/10/16  Lucia EstelleFeng Elliyah Liszewski, NP   Meds Ordered and Administered this Visit  Medications - No data to display  BP 120/75 (BP Location: Left Arm)   Pulse 80   Temp 97.8 F (36.6 C) (Oral)   Resp 16   Ht 5\' 7"  (1.702 m)   Wt 185 lb (83.9 kg)   SpO2 97%   BMI 28.98 kg/m  No data found.   Physical Exam  Constitutional: He is oriented to person, place, and time. He appears well-developed and well-nourished.  HENT:  Head: Normocephalic and atraumatic.  Right Ear: External ear normal.  Left Ear: External ear normal.  Nose: Nose normal.  Mouth/Throat: Oropharynx is clear and moist. No oropharyngeal exudate.  Right TM is gray with no erythema. Left TM has cerumen impaction.  No sinus pain on percussion.  Eyes: Conjunctivae and EOM are normal. Pupils are equal, round, and reactive to light. Right eye exhibits no discharge. Left eye exhibits no discharge.  Neck: Normal range of motion. Neck supple.  Cardiovascular: Normal rate, regular rhythm and normal heart sounds.   Pulmonary/Chest: Effort normal and breath sounds normal. No respiratory distress. He has no wheezes.  Abdominal: Soft. Bowel sounds are normal. He exhibits no distension. There is no tenderness.  Lymphadenopathy:    He has no cervical adenopathy.  Neurological: He is alert and oriented to person, place,  and time.  Skin: Skin is warm and dry.  Psychiatric: He has a normal mood and affect.  Nursing note and vitals reviewed.   Urgent Care Course   Clinical Course     Procedures (including critical care time)  Labs Review Labs Reviewed  RAPID INFLUENZA A&B ANTIGENS [facility] ONLY)    Imaging Review Dg Chest 2 View  Result Date: 05/05/2016 CLINICAL DATA:  Cough for 2 months EXAM: CHEST  2  VIEW COMPARISON:  07/19/2009 FINDINGS: Heart and mediastinal contours are within normal limits. No focal opacities or effusions. No acute bony abnormality. IMPRESSION: No active cardiopulmonary disease. Electronically Signed   By: Charlett NoseKevin  Dover M.D.   On: 05/05/2016 09:44     09:37: Chest x-ray ordered per request; I think this is appropriate as well. Will also check patient for influenza.   MDM   1. Bronchitis   2. Coughing   3. Nasal congestion    Influenza testing negative. Chest x-ray unremarkable. Symptoms have been present for 2 months on and off with no complete resolution. Patient failed amoxicillin 1 month ago  Considering the duration of his symptoms, I believe patient will benefit from another round of antibiotic. Azithromycin 500 mg daily x 5 days given.   Informed to f/u with PCP or return if he continues to not improve.    Lucia EstelleFeng Hamza Empson, NP 05/05/16 1034

## 2016-05-05 NOTE — Discharge Instructions (Signed)
He was negative today. Her chest x-ray is negative today. I do not have a clear diagnosis for you today. I do not have an explanation to why you have been having these symptoms on and off for the past 2 month. You may have a viral illness that is recurrent or you may have atypical pneumonia.   I believe it would benefit you to start another round of antibiotic. I believe Azithromycin would be the best antibiotic for you today.   Take the medication as prescribed. Please follow up with the primary care doctor to be did not improve.

## 2016-05-05 NOTE — ED Triage Notes (Signed)
Patient c/o cough, chest congestion, nasal congestion, and runny nose off and on for the past 2 months.

## 2016-05-07 ENCOUNTER — Ambulatory Visit
Admission: EM | Admit: 2016-05-07 | Discharge: 2016-05-07 | Disposition: A | Discharge status: Home / Self Care | Payer: BLUE CROSS/BLUE SHIELD | Attending: Emergency Medicine | Admitting: Emergency Medicine

## 2016-05-07 DIAGNOSIS — H65191 Other acute nonsuppurative otitis media, right ear: Secondary | ICD-10-CM | POA: Diagnosis not present

## 2016-05-07 MED ORDER — LEVOFLOXACIN 750 MG PO TABS: 750.0000 mg | ORAL_TABLET | Freq: Every day | ORAL | 0 refills | Status: AC

## 2016-05-07 NOTE — ED Provider Notes (Signed)
CSN: [id number]     Arrival date & time 05/07/16  1507 History   None    Chief Complaint  Patient presents with  . 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 Patient was seen 2 days ago by me for similar symptoms. Patient reports being sick for the past 2 months on and off. Patient reports that his symptoms never completely went away. Patient reports fatigue, chills, congestion, ear pain, runny nose, sinus pressure/pain, sneezing, coughing and body aches. Patient failed amoxicillin a month ago. Chest x-ray 2 days ago was negative. Influenza testing 2 days was also negative. Patient was put on Z-Pak 2 days ago and he did started this z-pak.   Patient is returning today stating that he is feeling worst with more right ear pain and more sinus pressure over the right maxillary sinuses.        Past Medical History:  Diagnosis Date  . Arrhythmia   . Chronic back pain   . Irregular heartbeat    Past Surgical History:  Procedure Laterality Date  . HERNIA REPAIR     History reviewed. No pertinent family history. Social History  Substance Use Topics  . Smoking status: Never Smoker  . Smokeless tobacco: Never Used  . Alcohol use Yes     Comment: rare    Review of Systems  Constitutional: Positive for chills and fatigue. Negative for fever.  HENT: Positive for congestion, ear pain, rhinorrhea, sinus pain, sinus pressure, sneezing and sore throat.   Respiratory: Positive for cough. Negative for shortness of breath and wheezing.   Cardiovascular: Negative for chest pain and palpitations.  Gastrointestinal: Positive for abdominal pain. Negative for nausea and vomiting.  Neurological: Negative for dizziness and headaches.    Allergies  Medrol [methylprednisolone]  Home Medications   Prior to Admission medications   Medication Sig  Start Date End Date Taking? Authorizing Provider  azithromycin (ZITHROMAX) 500 MG tablet Take 1 tablet (500 mg total) by mouth daily. 05/05/16 05/10/16 Yes Lucia Estelle, NP  levofloxacin (LEVAQUIN) 750 MG tablet Take 1 tablet (750 mg total) by mouth daily. 05/07/16 05/12/16  Lucia Estelle, NP   Meds Ordered and Administered this Visit  Medications - No data to display  BP 126/83 (BP Location: Left Arm)   Pulse 83   Temp 98.7 F (37.1 C) (Oral)   Resp 18   Ht 5\' 7"  (1.702 m)   Wt 185 lb (83.9 kg)   SpO2 99%   BMI 28.98 kg/m  No data found.   Physical Exam  Constitutional: He is oriented to person, place, and time. He appears well-developed and well-nourished.  HENT:  Head: Normocephalic and atraumatic.  Left TM has cerumen impaction. Right TM is erythematous without bulging and perforation. Patient does have maxillary sinuse pain on percussion.  Eyes: Pupils are equal, round, and reactive to light.  Neck: Normal range of motion. Neck supple.  Cardiovascular: Normal rate, regular rhythm and normal heart sounds.   Pulmonary/Chest: Effort normal and breath sounds normal. No respiratory distress. He has no wheezes.  Lymphadenopathy:    He has no cervical adenopathy.  Neurological: He is alert and oriented to person, place, and time.    Urgent Care Course   Clinical Course     Procedures (including critical care time)  Labs Review Labs Reviewed - No data to display  Imaging Review No results found.   MDM   1. Other  acute nonsuppurative otitis media of right ear, recurrence not specified    Patient was seen 2 days ago with negative chest x-ray and negative influenza. He is returning back today reporting that he is getting worse. On exam he does have a right ear infection that was not present 2 days ago.   Patient have failed amoxicillin and Z-Pak. Will start patient on Levaquin 750 mg daily by mouth 5 days for the ear infection and empirically for sinusitis. Informed to follow  with PCP if he continues to improve.   Lucia EstelleFeng Jaimey Franchini, NP 05/07/16 [phone]

## 2016-05-07 NOTE — ED Triage Notes (Signed)
Patient complains of sinus pain and pressure. Patient states that he was seen here on Saturday. Patient re;ports that he was given a  Z Pak. Patient states that he cannot hear out of either ear and feels worse than he did 2 days ago.

## 2016-06-06 ENCOUNTER — Encounter: Payer: Self-pay | Admitting: *Deleted

## 2016-06-07 ENCOUNTER — Encounter: Payer: Self-pay | Admitting: General Surgery

## 2016-06-07 ENCOUNTER — Ambulatory Visit (INDEPENDENT_AMBULATORY_CARE_PROVIDER_SITE_OTHER): Payer: BLUE CROSS/BLUE SHIELD | Admitting: General Surgery

## 2016-06-07 VITALS — BP 120/78 | HR 88 | Resp 12 | Ht 67.0 in | Wt 197.0 lb

## 2016-06-07 DIAGNOSIS — K625 Hemorrhage of anus and rectum: Secondary | ICD-10-CM | POA: Diagnosis not present

## 2016-06-07 MED ORDER — HYDROCORTISONE ACE-PRAMOXINE 2.5-1 % RE CREA: 1.0000 "application " | TOPICAL_CREAM | Freq: Two times a day (BID) | RECTAL | 0 refills | Status: DC

## 2016-06-07 MED ORDER — POLYETHYLENE GLYCOL 3350 17 GM/SCOOP PO POWD: ORAL | 0 refills | Status: DC

## 2016-06-07 NOTE — Progress Notes (Signed)
Patient ID: Michael Jenkins, male   DOB: [birth year], 31 y.o.   MRN: [id number]  Chief Complaint  Patient presents with  . Rectal Bleeding    HPI Michael Jenkins is a 31 y.o. male here today for a evaluation of rectal bleeding. He states he noticed this about seven months ago. He states he noticed some bright red blood on the paper and in the bowl and some mixed in his stool. Mom has colon cancer. Pain when he has a bowel movements in the last month..Moves his bowels three times daily.  HPI  Past Medical History:  Diagnosis Date  . Arrhythmia   . Chronic back pain   . Irregular heartbeat     Past Surgical History:  Procedure Laterality Date  . HERNIA REPAIR  1986, 2005   umbilical     Family History  Problem Relation Age of Onset  . Colon cancer Mother     Social History Social History  Substance Use Topics  . Smoking status: Never Smoker  . Smokeless tobacco: Never Used  . Alcohol use Yes     Comment: rare    Allergies  Allergen Reactions  . Medrol [Methylprednisolone]     ALLERGY TO STEROID DOSE PACK    Current Outpatient Prescriptions  Medication Sig Dispense Refill  . hydrocortisone-pramoxine (ANALPRAM HC) 2.5-1 % rectal cream Place 1 application rectally 2 (two) times daily. 30 g 0  . polyethylene glycol powder (GLYCOLAX/MIRALAX) powder 255 grams one bottle for colonoscopy prep 255 g 0   No current facility-administered medications for this visit.     Review of Systems Review of Systems  Constitutional: Negative.   Respiratory: Negative.   Cardiovascular: Negative.   Gastrointestinal: Positive for anal bleeding.    Blood pressure 120/78, pulse 88, resp. rate 12, height 5\' 7"  (1.702 m), weight 197 lb (89.4 kg).  Physical Exam Physical Exam  Constitutional: He is oriented to person, place, and time. He appears well-developed and well-nourished.  Eyes: Conjunctivae are normal. No scleral icterus.  Neck: Neck supple.  Cardiovascular: Normal rate, regular  rhythm and normal heart sounds.   Pulmonary/Chest: Effort normal and breath sounds normal.  Abdominal: Soft. Bowel sounds are normal.  Lymphadenopathy:    He has no cervical adenopathy.  Neurological: He is alert and oriented to person, place, and time.  Skin: Skin is warm and dry.    Data Reviewed Anoscopy was completed showing a small posterior fissure perhaps 4 mm in length, 1 mm in width. Minimal local tenderness. No active bleeding. No significant hemorrhoids identified. No stool for Hemoccult. Visualized lower rectal mucosa was normal.  CBC showed a hemoglobin of 16 with an MCV of 85, white blood cell count 5600. Hemoglobin in October 2016 was 16.0. Hemoglobin in September 2015: 17.4.  Assessment    Intermittent rectal bleeding without anorectal source evident on today's exam.    Plan    The patient's mother is under treatment for advanced colorectal cancer. While it's unlikely that this is malignancy, with his history of blood mixed in with the stool colonoscopy is mandated.    Colonoscopy with possible biopsy/polypectomy prn: Information regarding the procedure, including its potential risks and complications (including but not limited to perforation of the bowel, which may require emergency surgery to repair, and bleeding) was verbally given to the patient. Educational information regarding lower intestinal endoscopy was given to the patient. Written instructions for how to complete the bowel prep using Miralax were provided. The importance of drinking ample  fluids to avoid dehydration as a result of the prep emphasized.  The patient was given samples of Analpram 2.5% cream to use twice a day at the area of fissure for symptomatic relief.  Patient has been scheduled for [facility]. This is to accommodate his work schedule.  This information has been scribed by Ples SpecterJessica Qualls CMA.   Earline MayotteByrnett, Weber Monnier W 06/08/2016, 10:02 AM

## 2016-06-07 NOTE — Patient Instructions (Signed)
Colonoscopy, Adult A colonoscopy is an exam to look at the entire large intestine. During the exam, a lubricated, bendable tube is inserted into the anus and then passed into the rectum, colon, and other parts of the large intestine. A colonoscopy is often done as a part of normal colorectal screening or in response to certain symptoms, such as anemia, persistent diarrhea, abdominal pain, and blood in the stool. The exam can help screen for and diagnose medical problems, including:  Tumors.  Polyps.  Inflammation.  Areas of bleeding. Tell a health care provider about:  Any allergies you have.  All medicines you are taking, including vitamins, herbs, eye drops, creams, and over-the-counter medicines.  Any problems you or family members have had with anesthetic medicines.  Any blood disorders you have.  Any surgeries you have had.  Any medical conditions you have.  Any problems you have had passing stool. What are the risks? Generally, this is a safe procedure. However, problems may occur, including:  Bleeding.  A tear in the intestine.  A reaction to medicines given during the exam.  Infection (rare). What happens before the procedure? Eating and drinking restrictions  Follow instructions from your health care provider about eating and drinking, which may include:  A few days before the procedure - follow a low-fiber diet. Avoid nuts, seeds, dried fruit, raw fruits, and vegetables.  1-3 days before the procedure - follow a clear liquid diet. Drink only clear liquids, such as clear broth or bouillon, black coffee or tea, clear juice, clear soft drinks or sports drinks, gelatin desert, and popsicles. Avoid any liquids that contain red or purple dye.  On the day of the procedure - do not eat or drink anything during the 2 hours before the procedure, or within the time period that your health care provider recommends. Bowel prep  If you were prescribed an oral bowel prep to  clean out your colon:  Take it as told by your health care provider. Starting the day before your procedure, you will need to drink a large amount of medicated liquid. The liquid will cause you to have multiple loose stools until your stool is almost clear or light green.  If your skin or anus gets irritated from diarrhea, you may use these to relieve the irritation:  Medicated wipes, such as adult wet wipes with aloe and vitamin E.  A skin soothing-product like petroleum jelly.  If you vomit while drinking the bowel prep, take a break for up to 60 minutes and then begin the bowel prep again. If vomiting continues and you cannot take the bowel prep without vomiting, call your health care provider. General instructions  Ask your health care provider about changing or stopping your regular medicines. This is especially important if you are taking diabetes medicines or blood thinners.  Plan to have someone take you home from the hospital or clinic. What happens during the procedure?  An IV tube may be inserted into one of your veins.  You will be given medicine to help you relax (sedative).  To reduce your risk of infection:  Your health care team will wash or sanitize their hands.  Your anal area will be washed with soap.  You will be asked to lie on your side with your knees bent.  Your health care provider will lubricate a long, thin, flexible tube. The tube will have a camera and a light on the end.  The tube will be inserted into your anus.    The tube will be gently eased through your rectum and colon.  Air will be delivered into your colon to keep it open. You may feel some pressure or cramping.  The camera will be used to take images during the procedure.  A small tissue sample may be removed from your body to be examined under a microscope (biopsy). If any potential problems are found, the tissue will be sent to a lab for testing.  If small polyps are found, your health  care provider may remove them and have them checked for cancer cells.  The tube that was inserted into your anus will be slowly removed. The procedure may vary among health care providers and hospitals. What happens after the procedure?  Your blood pressure, heart rate, breathing rate, and blood oxygen level will be monitored until the medicines you were given have worn off.  Do not drive for 24 hours after the exam.  You may have a small amount of blood in your stool.  You may pass gas and have mild abdominal cramping or bloating due to the air that was used to inflate your colon during the exam.  It is up to you to get the results of your procedure. Ask your health care provider, or the department performing the procedure, when your results will be ready. This information is not intended to replace advice given to you by your health care provider. Make sure you discuss any questions you have with your health care provider. Document Released: 04/20/2000 Document Revised: 11/11/2015 Document Reviewed: 07/05/2015 Elsevier Interactive Patient Education  2017 Elsevier Inc.  

## 2016-06-08 DIAGNOSIS — K625 Hemorrhage of anus and rectum: Secondary | ICD-10-CM | POA: Insufficient documentation

## 2016-06-08 LAB — CBC WITH DIFFERENTIAL/PLATELET
BASOS ABS: 0 10*3/uL (ref 0.0–0.2)
Basos: 0 %
EOS (ABSOLUTE): 0.1 10*3/uL (ref 0.0–0.4)
Eos: 1 %
Hematocrit: 45.5 % (ref 37.5–51.0)
Hemoglobin: 16 g/dL (ref 13.0–17.7)
Immature Grans (Abs): 0 10*3/uL (ref 0.0–0.1)
Immature Granulocytes: 0 %
LYMPHS ABS: 1.8 10*3/uL (ref 0.7–3.1)
Lymphs: 31 %
MCH: 30 pg (ref 26.6–33.0)
MCHC: 35.2 g/dL (ref 31.5–35.7)
MCV: 85 fL (ref 79–97)
MONOCYTES: 8 %
MONOS ABS: 0.4 10*3/uL (ref 0.1–0.9)
NEUTROS ABS: 3.3 10*3/uL (ref 1.4–7.0)
Neutrophils: 60 %
Platelets: 189 10*3/uL (ref 150–379)
RBC: 5.34 x10E6/uL (ref 4.14–5.80)
RDW: 12.3 % (ref 12.3–15.4)
WBC: 5.6 10*3/uL (ref 3.4–10.8)

## 2016-07-02 ENCOUNTER — Telehealth: Payer: Self-pay

## 2016-07-02 NOTE — Telephone Encounter (Signed)
Patient called to reschedule his colonoscopy scheduled for 08/08/16. He is no scheduled [facility]. They are aware to call the day before to get their arrival time. Miralax prescription has already been sent into the patient's pharmacy. The patient is aware of date and instructions.

## 2016-08-15 ENCOUNTER — Telehealth: Payer: Self-pay | Admitting: *Deleted

## 2016-08-15 NOTE — Telephone Encounter (Signed)
Patient was contacted today and confirms no medication changes since his last office visit.   This patient reports that he has not yet picked up Miralax prescription but was instructed to do so.  We will proceed with colonoscopy as scheduled for 08-22-16 at [facility].   Patient was encouraged to call the office should he have further questions.

## 2016-08-20 ENCOUNTER — Other Ambulatory Visit: Payer: Self-pay | Admitting: *Deleted

## 2016-08-20 MED ORDER — POLYETHYLENE GLYCOL 3350 17 GM/SCOOP PO POWD: ORAL | 0 refills | Status: DC

## 2016-08-21 ENCOUNTER — Encounter: Payer: Self-pay | Admitting: *Deleted

## 2016-08-22 ENCOUNTER — Ambulatory Visit: Payer: BLUE CROSS/BLUE SHIELD | Admitting: Anesthesiology

## 2016-08-22 ENCOUNTER — Encounter
Admission: RE | Disposition: A | Discharge status: Home / Self Care | Payer: Self-pay | Source: Ambulatory Visit | Attending: General Surgery

## 2016-08-22 ENCOUNTER — Ambulatory Visit
Admission: RE | Admit: 2016-08-22 | Discharge: 2016-08-22 | Disposition: A | Discharge status: Home / Self Care | Payer: BLUE CROSS/BLUE SHIELD | Source: Ambulatory Visit | Attending: General Surgery | Admitting: General Surgery

## 2016-08-22 DIAGNOSIS — Z800 Family history of malignant neoplasm of digestive organs: Secondary | ICD-10-CM | POA: Insufficient documentation

## 2016-08-22 DIAGNOSIS — G8929 Other chronic pain: Secondary | ICD-10-CM | POA: Insufficient documentation

## 2016-08-22 DIAGNOSIS — K625 Hemorrhage of anus and rectum: Secondary | ICD-10-CM | POA: Diagnosis not present

## 2016-08-22 DIAGNOSIS — Z9889 Other specified postprocedural states: Secondary | ICD-10-CM | POA: Insufficient documentation

## 2016-08-22 DIAGNOSIS — Z888 Allergy status to other drugs, medicaments and biological substances status: Secondary | ICD-10-CM | POA: Insufficient documentation

## 2016-08-22 DIAGNOSIS — M549 Dorsalgia, unspecified: Secondary | ICD-10-CM | POA: Insufficient documentation

## 2016-08-22 HISTORY — PX: COLONOSCOPY WITH PROPOFOL: SHX5780

## 2016-08-22 SURGERY — COLONOSCOPY WITH PROPOFOL
Anesthesia: General

## 2016-08-22 MED ORDER — PROPOFOL 500 MG/50ML IV EMUL
INTRAVENOUS | Status: DC | PRN
  Administered 2016-08-22: 125 ug/kg/min via INTRAVENOUS

## 2016-08-22 MED ORDER — PROPOFOL 500 MG/50ML IV EMUL
INTRAVENOUS | Status: AC
  Filled 2016-08-22: qty 50

## 2016-08-22 MED ORDER — LACTATED RINGERS IV SOLN
INTRAVENOUS | Status: DC | PRN
  Administered 2016-08-22: 09:00:00 via INTRAVENOUS

## 2016-08-22 MED ORDER — SODIUM CHLORIDE 0.9 % IV SOLN
INTRAVENOUS | Status: DC
  Administered 2016-08-22: 09:00:00 via INTRAVENOUS

## 2016-08-22 MED ORDER — PROPOFOL 10 MG/ML IV BOLUS
INTRAVENOUS | Status: DC | PRN
  Administered 2016-08-22: 40 mg via INTRAVENOUS

## 2016-08-22 NOTE — Anesthesia Preprocedure Evaluation (Signed)
Anesthesia Evaluation  Patient identified by MRN, date of birth, ID band Patient awake    Reviewed: Allergy & Precautions, H&P , NPO status , Patient's Chart, lab work & pertinent test results  History of Anesthesia Complications Negative for: history of anesthetic complications  Airway Mallampati: III  TM Distance: >3 FB Neck ROM: full    Dental  (+) Chipped, Caps   Pulmonary neg pulmonary ROS, neg shortness of breath,    Pulmonary exam normal breath sounds clear to auscultation       Cardiovascular Exercise Tolerance: Good Normal cardiovascular exam+ dysrhythmias  Rhythm:regular Rate:Normal     Neuro/Psych negative neurological ROS  negative psych ROS   GI/Hepatic Neg liver ROS, GERD  Controlled,  Endo/Other  negative endocrine ROS  Renal/GU negative Renal ROS  negative genitourinary   Musculoskeletal   Abdominal   Peds  Hematology negative hematology ROS (+)   Anesthesia Other Findings Signs and symptoms suggestive of sleep apnea   Past Medical History: No date: Arrhythmia No date: Chronic back pain No date: Irregular heartbeat  Past Surgical History: 1986, 2005: HERNIA REPAIR     Comment: umbilical   BMI    Body Mass Index:  28.98 kg/m      Reproductive/Obstetrics negative OB ROS                             Anesthesia Physical Anesthesia Plan  ASA: III  Anesthesia Plan: General   Post-op Pain Management:    Induction:   Airway Management Planned:   Additional Equipment:   Intra-op Plan:   Post-operative Plan:   Informed Consent: I have reviewed the patients History and Physical, chart, labs and discussed the procedure including the risks, benefits and alternatives for the proposed anesthesia with the patient or authorized representative who has indicated his/her understanding and acceptance.   Dental Advisory Given  Plan Discussed with: Anesthesiologist,  CRNA and Surgeon  Anesthesia Plan Comments:         Anesthesia Quick Evaluation

## 2016-08-22 NOTE — Transfer of Care (Signed)
Immediate Anesthesia Transfer of Care Note  Patient: Michael Jenkins  Procedure(s) Performed: Procedure(s): COLONOSCOPY WITH PROPOFOL (N/A)  Patient Location: PACU  Anesthesia Type:General  Level of Consciousness: sedated  Airway & Oxygen Therapy: Patient Spontanous Breathing and Patient connected to nasal cannula oxygen  Post-op Assessment: Report given to RN and Post -op Vital signs reviewed and stable  Post vital signs: Reviewed and stable  Last Vitals:  Vitals:   08/22/16 0827  BP: 132/75  Pulse: 63  Resp: 18  Temp: 36.2 C    Last Pain:  Vitals:   08/22/16 0827  TempSrc: Oral         Complications: No apparent anesthesia complications and Patient re-intubated

## 2016-08-22 NOTE — Anesthesia Post-op Follow-up Note (Cosign Needed)
Anesthesia QCDR form completed.        

## 2016-08-22 NOTE — H&P (Signed)
TARIG ZIMMERS [id number] 1986-01-29     HPI: No further rectal bleeding. Tolerated prep well.    Prescriptions Prior to Admission  Medication Sig Dispense Refill Last Dose  . polyethylene glycol powder (GLYCOLAX/MIRALAX) powder 255 grams one bottle for colonoscopy prep 255 g 0 08/21/2016 at Unknown time  . hydrocortisone-pramoxine (ANALPRAM HC) 2.5-1 % rectal cream Place 1 application rectally 2 (two) times daily. (Patient not taking: Reported on 08/22/2016) 30 g 0 Not Taking at Unknown time   Allergies  Allergen Reactions  . Medrol [Methylprednisolone]     ALLERGY TO STEROID DOSE PACK   Past Medical History:  Diagnosis Date  . Arrhythmia   . Chronic back pain   . Irregular heartbeat    Past Surgical History:  Procedure Laterality Date  . HERNIA REPAIR  1986, 2005   umbilical    Social History   Social History  . Marital status: Married    Spouse name: N/A  . Number of children: N/A  . Years of education: N/A   Occupational History  . Not on file.   Social History Main Topics  . Smoking status: Never Smoker  . Smokeless tobacco: Never Used  . Alcohol use Yes     Comment: rare  . Drug use: No  . Sexual activity: Not on file   Other Topics Concern  . Not on file   Social History Narrative  . No narrative on file   Social History   Social History Narrative  . No narrative on file     ROS: Negative.     PE: HEENT: Negative. Lungs: Clear. Cardio: RR.  Assessment/Plan:  Proceed with planned endoscopy.    Earline Mayotte 08/22/2016

## 2016-08-22 NOTE — Op Note (Addendum)
[facility] Patient Name: Michael Jenkins Procedure Date: 08/22/2016 9:08 AM MRN: [id number] Account #: [id number] Date of Birth: 04-28-1986 Admit Type: Outpatient Age: 31 Room: [facility] ENDO ROOM 1 Gender: Male Note Status: Finalized Procedure:            Colonoscopy Indications:          Family history of hereditary nonpolyposis colorectal                        cancer in a first-degree relative Providers:            Earline Mayotte, MD Medicines:            Monitored Anesthesia Care Complications:        No immediate complications. Procedure:            Pre-Anesthesia Assessment:                       - Prior to the procedure, a History and Physical was                        performed, and patient medications, allergies and                        sensitivities were reviewed. The patient's tolerance of                        previous anesthesia was reviewed.                       - The risks and benefits of the procedure and the                        sedation options and risks were discussed with the                        patient. All questions were answered and informed                        consent was obtained.                       After obtaining informed consent, the colonoscope was                        passed under direct vision. Throughout the procedure,                        the patient's blood pressure, pulse, and oxygen                        saturations were monitored continuously. The                        Colonoscope was introduced through the anus and                        advanced to the the terminal ileum. The colonoscopy was                        performed without difficulty. The patient tolerated the  procedure well. The quality of the bowel preparation                        was excellent. Findings:      The entire examined colon appeared normal on direct and retroflexion        views. Impression:           - The entire examined colon is normal on direct and                        retroflexion views.                       - No specimens collected. Recommendation:       - Repeat colonoscopy in 5 years for surveillance. Procedure Code(s):    --- Professional ---                       [phone], Colonoscopy, flexible; diagnostic, including                        collection of specimen(s) by brushing or washing, when                        performed (separate procedure) Diagnosis Code(s):    --- Professional ---                       Z80.0, Family history of malignant neoplasm of                        digestive organs CPT copyright 2016 American Medical Association. All rights reserved. The codes documented in this report are preliminary and upon coder review may  be revised to meet current compliance requirements. Earline Mayotte, MD 08/22/2016 9:34:45 AM This report has been signed electronically. Number of Addenda: 0 Note Initiated On: 08/22/2016 9:08 AM Scope Withdrawal Time: 0 hours 7 minutes 47 seconds  Total Procedure Duration: 0 hours 11 minutes 0 seconds       [facility]

## 2016-08-22 NOTE — Anesthesia Postprocedure Evaluation (Signed)
Anesthesia Post Note  Patient: Michael Jenkins  Procedure(s) Performed: Procedure(s) (LRB): COLONOSCOPY WITH PROPOFOL (N/A)  Patient location during evaluation: Endoscopy Anesthesia Type: General Level of consciousness: awake and alert Pain management: pain level controlled Vital Signs Assessment: post-procedure vital signs reviewed and stable Respiratory status: spontaneous breathing, nonlabored ventilation, respiratory function stable and patient connected to nasal cannula oxygen Cardiovascular status: blood pressure returned to baseline and stable Postop Assessment: no signs of nausea or vomiting Anesthetic complications: no     Last Vitals:  Vitals:   08/22/16 1008 08/22/16 1018  BP: 115/78   Pulse: 77 72  Resp: 14 15  Temp:      Last Pain:  Vitals:   08/22/16 0938  TempSrc: Tympanic                 Cleda Mccreedy Floyde Dingley

## 2016-08-23 ENCOUNTER — Encounter: Payer: Self-pay | Admitting: General Surgery

## 2016-08-28 NOTE — Progress Notes (Signed)
Diagnostic based on family history of colon cancer.

## 2017-06-19 ENCOUNTER — Other Ambulatory Visit: Payer: Self-pay

## 2017-06-19 ENCOUNTER — Encounter: Payer: Self-pay | Admitting: Emergency Medicine

## 2017-06-19 ENCOUNTER — Ambulatory Visit
Admission: EM | Admit: 2017-06-19 | Discharge: 2017-06-19 | Disposition: A | Discharge status: Home / Self Care | Payer: BLUE CROSS/BLUE SHIELD | Attending: Family Medicine | Admitting: Family Medicine

## 2017-06-19 DIAGNOSIS — J40 Bronchitis, not specified as acute or chronic: Secondary | ICD-10-CM

## 2017-06-19 MED ORDER — DOXYCYCLINE HYCLATE 100 MG PO CAPS: 100.0000 mg | ORAL_CAPSULE | Freq: Two times a day (BID) | ORAL | 0 refills | Status: DC

## 2017-06-19 MED ORDER — HYDROCOD POLST-CPM POLST ER 10-8 MG/5ML PO SUER: 5.0000 mL | Freq: Two times a day (BID) | ORAL | 0 refills | Status: DC | PRN

## 2017-06-19 NOTE — ED Triage Notes (Signed)
Patient c/o cough and chest congestion for a month.  Patient denies fevers.  

## 2017-06-19 NOTE — ED Provider Notes (Signed)
[facility]    CSN: [id number] Arrival date & time: 06/19/17  0904  History   Chief Complaint Chief Complaint  Patient presents with  . Cough   HPI  32 year old male presents with cough.  Patient reports he has had a productive cough for the past month.  No reported sick contacts.  No associated fever.  No shortness of breath.  He is a non-smoker.  He states that he is having difficulty getting the cough to subside.  Interferes with sleep.  No known exacerbating or relieving factors.  No other associated symptoms.  No other complaints.  Past Medical History:  Diagnosis Date  . Arrhythmia   . Chronic back pain   . Irregular heartbeat    Patient Active Problem List   Diagnosis Date Noted  . Rectal bleeding 06/08/2016   Past Surgical History:  Procedure Laterality Date  . COLONOSCOPY WITH PROPOFOL N/A 08/22/2016   Procedure: COLONOSCOPY WITH PROPOFOL;  Surgeon: Earline MayotteJeffrey W Byrnett, MD;  [facility];  Service: Endoscopy;  Laterality: N/A;  . HERNIA REPAIR  1986, 2005   umbilical   . KNEE SURGERY      Home Medications    Prior to Admission medications   Medication Sig Start Date End Date Taking? Authorizing Provider  chlorpheniramine-HYDROcodone (TUSSIONEX PENNKINETIC ER) 10-8 MG/5ML SUER Take 5 mLs by mouth every 12 (twelve) hours as needed. 06/19/17   Tommie Samsook, Kamryn Messineo G, DO  doxycycline (VIBRAMYCIN) 100 MG capsule Take 1 capsule (100 mg total) by mouth 2 (two) times daily. 06/19/17   Tommie Samsook, Jaleigha Deane G, DO    Family History Family History  Problem Relation Age of Onset  . Colon cancer Mother     Social History Social History   Tobacco Use  . Smoking status: Never Smoker  . Smokeless tobacco: Never Used  Substance Use Topics  . Alcohol use: Yes    Comment: rare  . Drug use: No   Allergies   Medrol [methylprednisolone]  Review of Systems Review of Systems  Constitutional: Negative.   Respiratory: Positive for cough. Negative for shortness of  breath.    Physical Exam Triage Vital Signs ED Triage Vitals  Enc Vitals Group     BP 06/19/17 0919 136/80     Pulse Rate 06/19/17 0919 73     Resp 06/19/17 0919 16     Temp 06/19/17 0919 98.1 F (36.7 C)     Temp Source 06/19/17 0919 Oral     SpO2 06/19/17 0919 98 %     Weight 06/19/17 0915 190 lb (86.2 kg)     Height 06/19/17 0915 5\' 7"  (1.702 m)     Head Circumference --      Peak Flow --      Pain Score 06/19/17 0915 0     Pain Loc --      Pain Edu? --      Excl. in GC? --    Updated Vital Signs BP 136/80 (BP Location: Left Arm)   Pulse 73   Temp 98.1 F (36.7 C) (Oral)   Resp 16   Ht 5\' 7"  (1.702 m)   Wt 190 lb (86.2 kg)   SpO2 98%   BMI 29.76 kg/m  Physical Exam  Constitutional: He is oriented to person, place, and time. He appears well-developed and well-nourished. No distress.  HENT:  Head: Normocephalic and atraumatic.  Mouth/Throat: Oropharynx is clear and moist.  Normal TM's bilaterally.  Cardiovascular: Normal rate and regular rhythm.  No murmur  heard. Pulmonary/Chest: Effort normal and breath sounds normal. He has no wheezes. He has no rales.  Neurological: He is alert and oriented to person, place, and time.  Psychiatric: He has a normal mood and affect. His behavior is normal.  Nursing note and vitals reviewed.  UC Treatments / Results  Labs (all labs ordered are listed, but only abnormal results are displayed) Labs Reviewed - No data to display  EKG  EKG Interpretation None       Radiology No results found.  Procedures Procedures (including critical care time)  Medications Ordered in UC Medications - No data to display   Initial Impression / Assessment and Plan / UC Course  I have reviewed the triage vital signs and the nursing notes.  Pertinent labs & imaging results that were available during my care of the patient were reviewed by me and considered in my medical decision making (see chart for details).     32 year old male  presents with subacute bronchitis.  Treating with doxycycline given continued symptoms and lack of improvement.  Tussionex for cough.  Final Clinical Impressions(s) / UC Diagnoses   Final diagnoses:  Bronchitis    ED Discharge Orders        Ordered    doxycycline (VIBRAMYCIN) 100 MG capsule  2 times daily     06/19/17 0951    chlorpheniramine-HYDROcodone (TUSSIONEX PENNKINETIC ER) 10-8 MG/5ML SUER  Every 12 hours PRN     06/19/17 0951     Controlled Substance Prescriptions [place] Controlled Substance Registry consulted? Not Applicable   Tommie Sams, DO 06/19/17 1000

## 2017-07-15 IMAGING — CR DG CHEST 2V
3 series · 3 of 3 positions shown · non-contrast
Comparison: 07/19/2009

CLINICAL DATA: Cough for 2 months

EXAM:
CHEST  2 VIEW

[chest pa (1 of 2)]
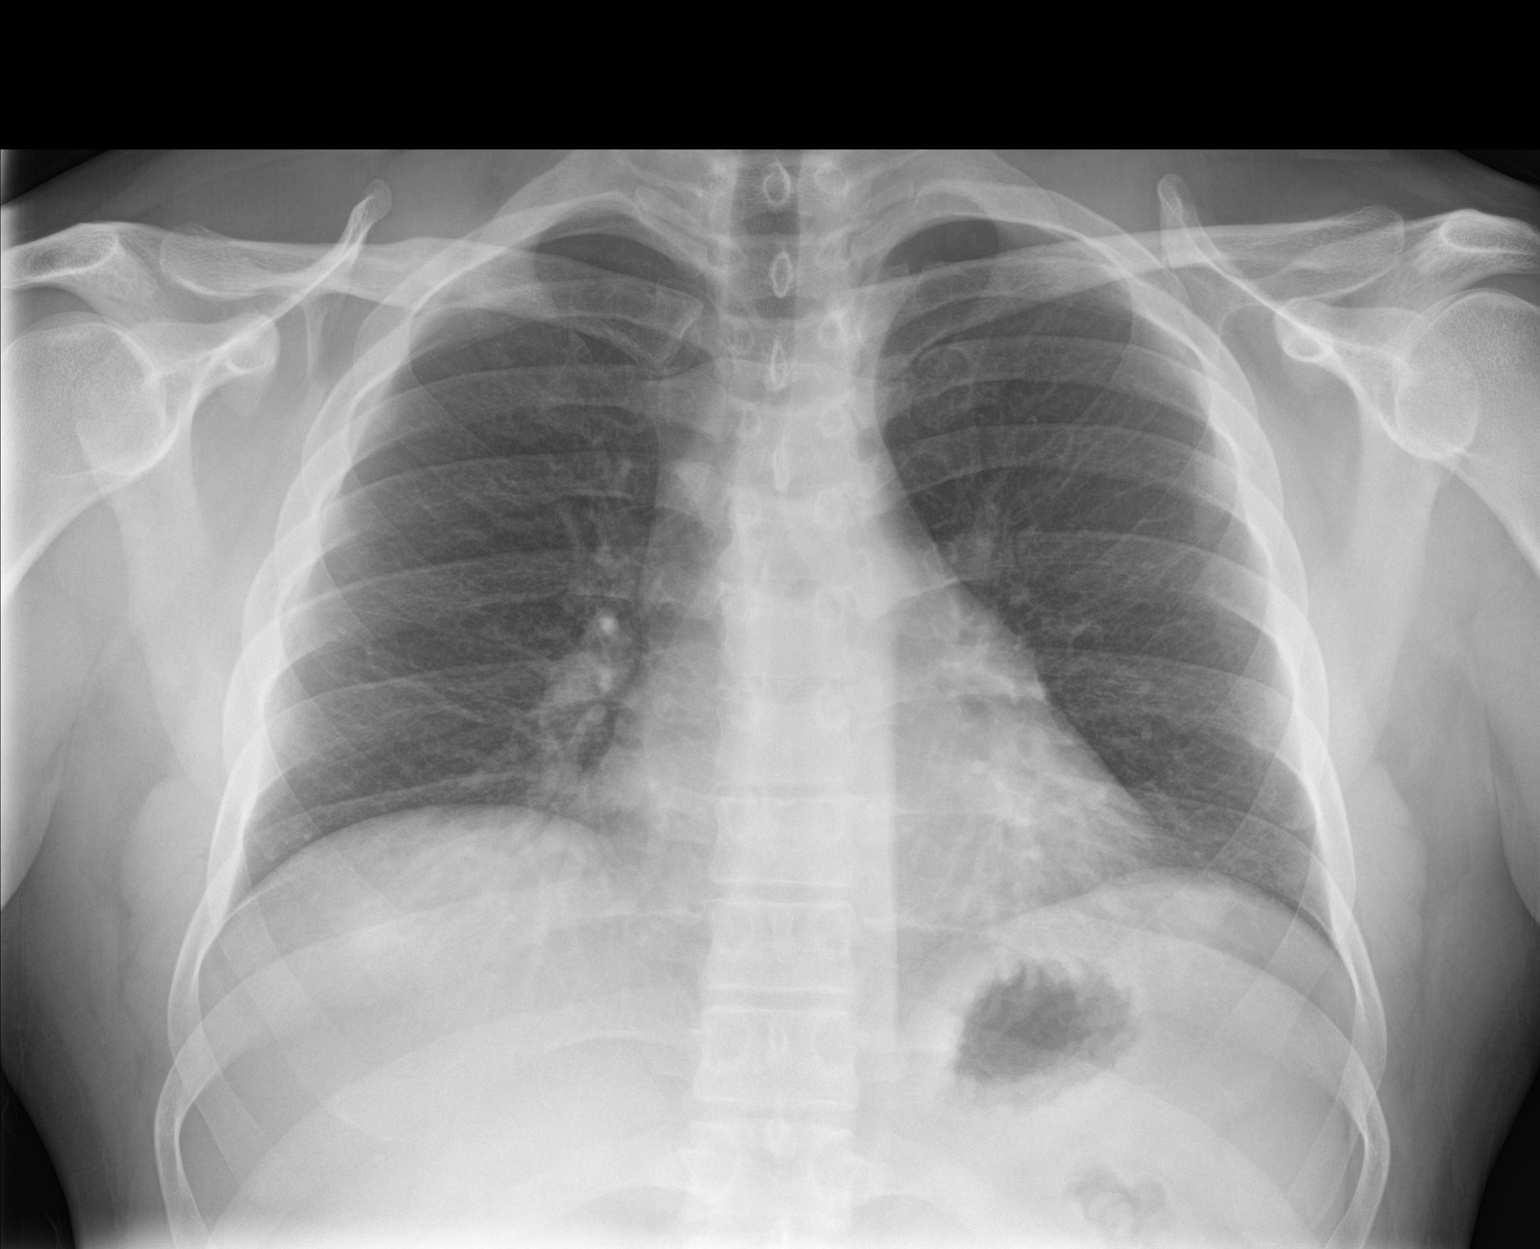

[chest lat]
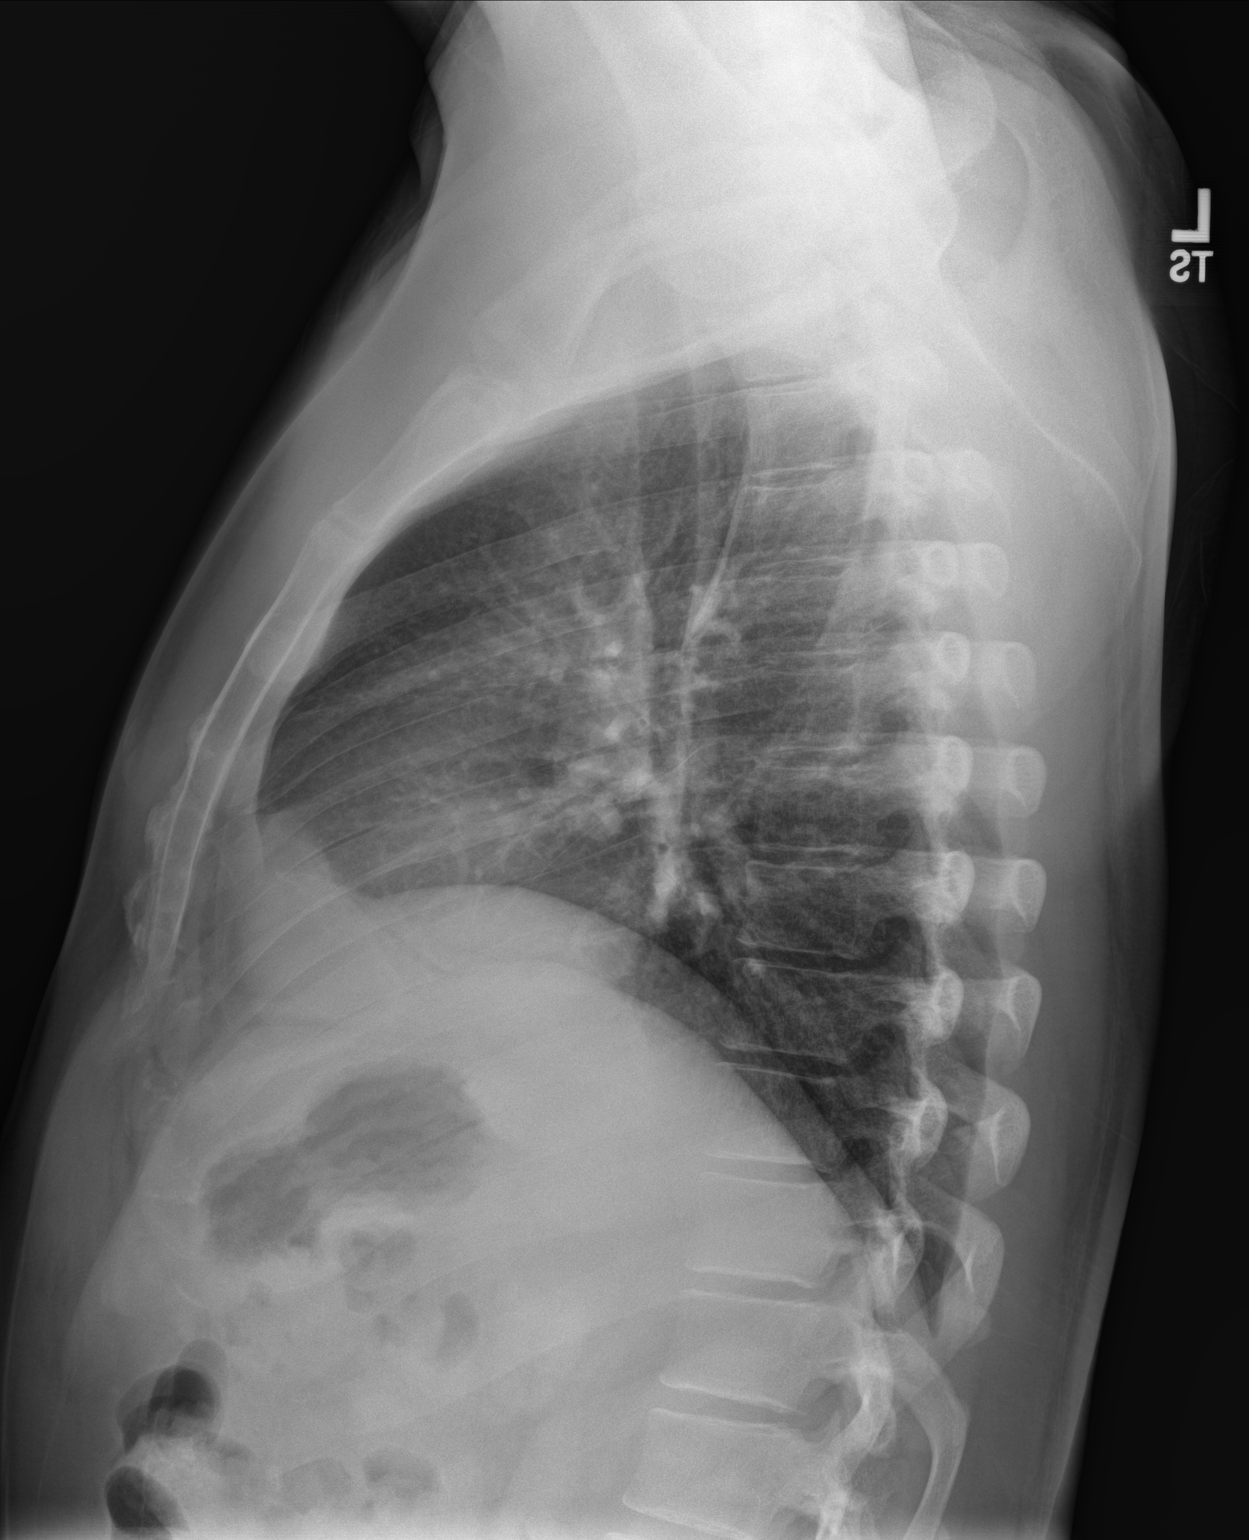

[chest pa (2 of 2)]
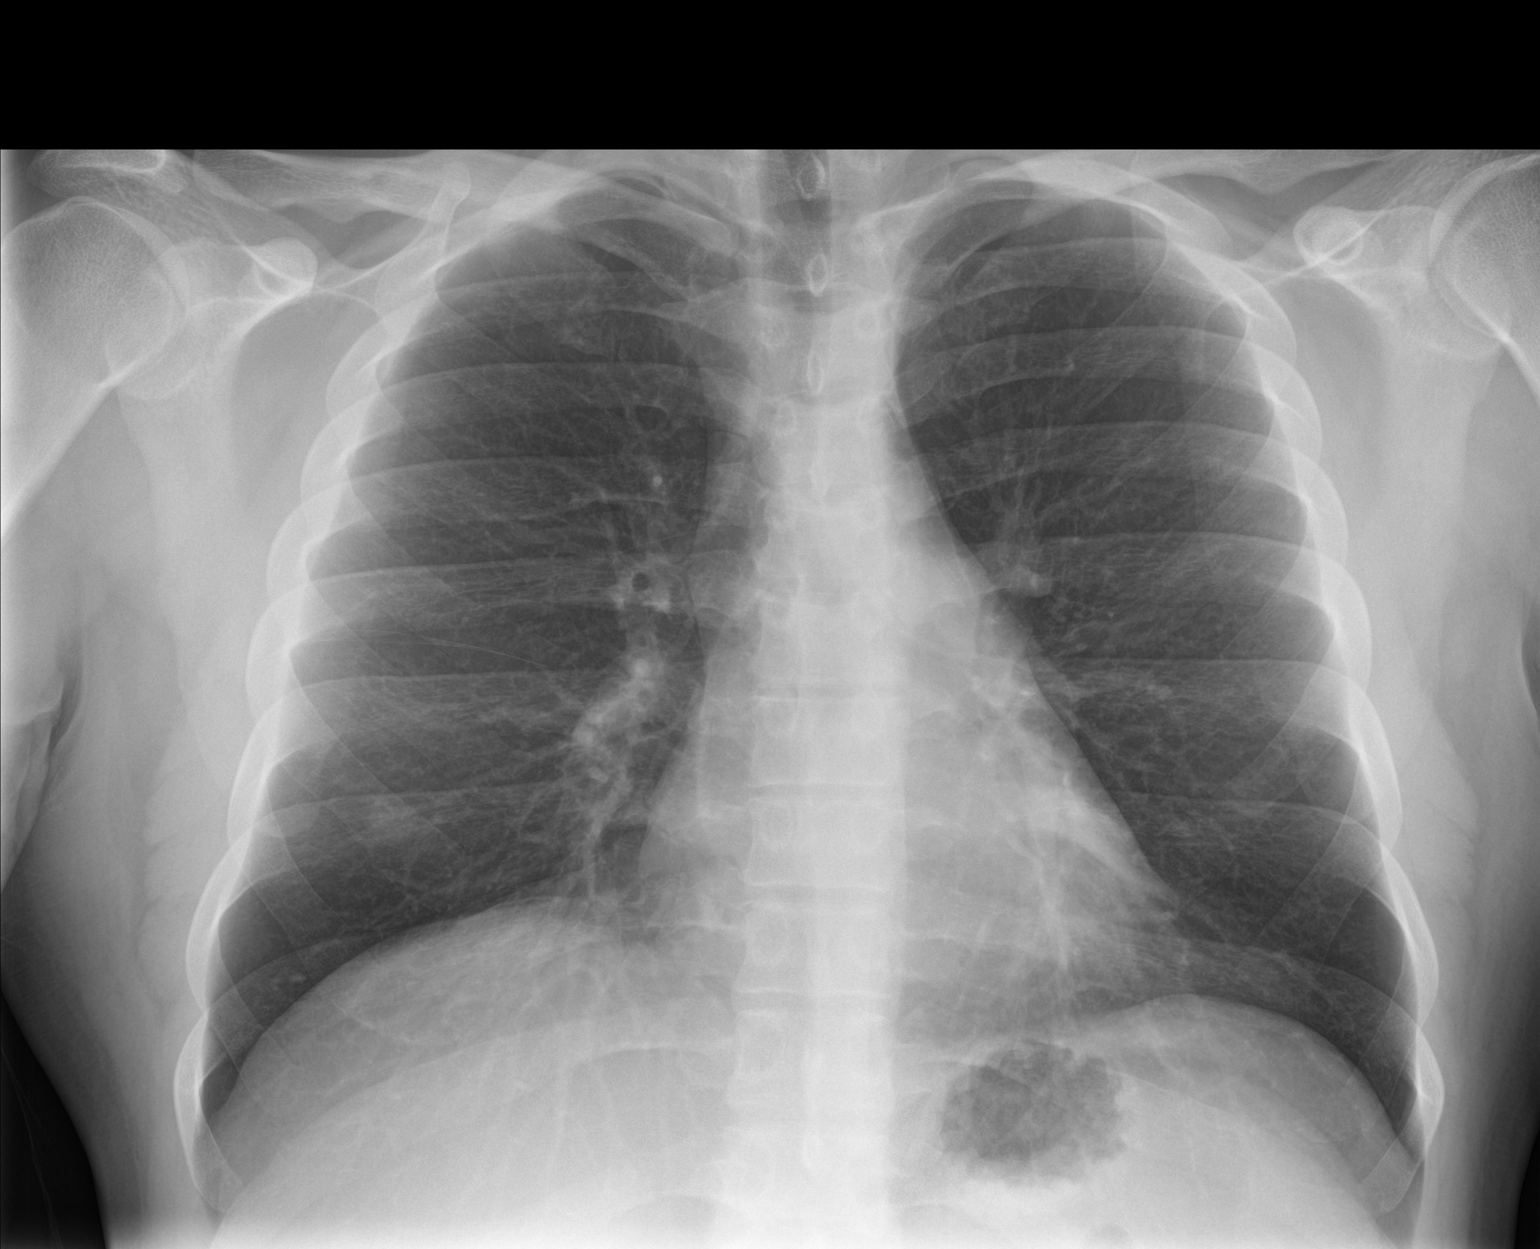

[3 of 3 positions shown; findings below may reference images not displayed]

FINDINGS: Heart and mediastinal contours are within normal limits. No focal
opacities or effusions. No acute bony abnormality.
IMPRESSION: No active cardiopulmonary disease.

## 2018-04-16 ENCOUNTER — Encounter: Payer: Self-pay | Admitting: Emergency Medicine

## 2018-04-16 ENCOUNTER — Ambulatory Visit
Admission: EM | Admit: 2018-04-16 | Discharge: 2018-04-16 | Disposition: A | Discharge status: Home / Self Care | Payer: BLUE CROSS/BLUE SHIELD | Attending: Family Medicine | Admitting: Family Medicine

## 2018-04-16 ENCOUNTER — Other Ambulatory Visit: Payer: Self-pay

## 2018-04-16 DIAGNOSIS — B9789 Other viral agents as the cause of diseases classified elsewhere: Secondary | ICD-10-CM

## 2018-04-16 DIAGNOSIS — J069 Acute upper respiratory infection, unspecified: Secondary | ICD-10-CM | POA: Diagnosis not present

## 2018-04-16 NOTE — ED Provider Notes (Signed)
[facility] ____________________________________________  Time seen: Approximately 11:59 AM  I have reviewed the triage vital signs and the nursing notes.   HISTORY  Chief Complaint Sinus Problem   HPI Michael Jenkins is a 32 y.o. male presenting for evaluation of 3 to 4 days of runny nose, nasal congestion, sinus pressure and ear congestion sensation.  States some pressure in his ears as well, currently mild.  Has not taken any over-the-counter medication for the same complaints.  Denies accompanying fevers, chest pain, shortness of breath, sore throat.  Has continued to eat and drink well.  Has continued remain active.  Patient expressed concern of congestion in his ears, as he states he feels that his hearing has been somewhat decreasing over the last year.  Patient follows with occupational health for his hearing, no previous ENT evaluation.  Denies other aggravating alleviating factors.  Denies recurrent seasonal allergies. Denies home sick contacts, but reports frequent sick contacts through work.  Reports otherwise doing well.    Past Medical History:  Diagnosis Date  . Arrhythmia   . Chronic back pain   . Irregular heartbeat     Patient Active Problem List   Diagnosis Date Noted  . Rectal bleeding 06/08/2016    Past Surgical History:  Procedure Laterality Date  . COLONOSCOPY WITH PROPOFOL N/A 08/22/2016   Procedure: COLONOSCOPY WITH PROPOFOL;  Surgeon: Earline Mayotte, MD;  Location: [facility] ENDOSCOPY;  Service: Endoscopy;  Laterality: N/A;  . HERNIA REPAIR  1986, 2005   umbilical   . KNEE SURGERY       No current facility-administered medications for this encounter.  No current outpatient medications on file.  Allergies Medrol [methylprednisolone]  Family History  Problem Relation Age of Onset  . Colon cancer Mother     Social History Social History   Tobacco Use  . Smoking status: Never Smoker  . Smokeless tobacco: Never Used  Substance  Use Topics  . Alcohol use: Yes    Comment: rare  . Drug use: No    Review of Systems Constitutional: No fever ENT: No sore throat.  As above. Cardiovascular: Denies chest pain. Respiratory: Denies shortness of breath. Gastrointestinal: No abdominal pain Skin: Negative for rash.   ____________________________________________   PHYSICAL EXAM:  VITAL SIGNS: ED Triage Vitals  Enc Vitals Group     BP 04/16/18 1131 139/90     Pulse Rate 04/16/18 1131 74     Resp 04/16/18 1131 16     Temp 04/16/18 1131 97.8 F (36.6 C)     Temp Source 04/16/18 1131 Oral     SpO2 04/16/18 1131 98 %     Weight 04/16/18 1128 180 lb (81.6 kg)     Height 04/16/18 1128 5\' 7"  (1.702 m)     Head Circumference --      Peak Flow --      Pain Score 04/16/18 1128 4     Pain Loc --      Pain Edu? --      Excl. in GC? --     Constitutional: Alert and oriented. Well appearing and in no acute distress. Eyes: Conjunctivae are normal.  Head: Atraumatic.No tenderness to palpation bilateral frontal and maxillary sinuses. No swelling. No erythema.   Ears: Left: Nontender, normal canal, no erythema, slight effusion present, otherwise normal TM.  Right: Nontender, normal canal, no erythema, normal TM.  Nose: nasal congestion with bilateral nasal turbinate erythema and edema.   Mouth/Throat: Mucous membranes are moist.  Oropharynx non-erythematous.No tonsillar swelling or exudate.  Neck: No stridor.  No cervical spine tenderness to palpation. Hematological/Lymphatic/Immunilogical: No cervical lymphadenopathy. Cardiovascular: Normal rate, regular rhythm. Grossly normal heart sounds.  Good peripheral circulation. Respiratory: Normal respiratory effort.  No retractions.No wheezes, rales or rhonchi. Good air movement.  Musculoskeletal: Steady gait.  Neurologic:  Normal speech and language.  No gait instability. Skin:  Skin is warm, dry and intact. No rash noted. Psychiatric: Mood and affect are normal. Speech and  behavior are normal.  ___________________________________________   LABS (all labs ordered are listed, but only abnormal results are displayed)  Labs Reviewed - No data to display ________________________________________   PROCEDURES Procedures   INITIAL IMPRESSION / ASSESSMENT AND PLAN / ED COURSE  Pertinent labs & imaging results that were available during my care of the patient were reviewed by me and considered in my medical decision making (see chart for details).  Well-appearing patient.  No acute distress.  Suspect viral upper respiratory infection.  Encouraged rest, fluids, supportive care, over-the-counter cough and decongestant agents.  Work note given.  Also counseled to follow-up with the ear nose and throat as he expressed hearing declining over the last year. Information given.  Discussed follow up and return parameters including no resolution or any worsening concerns. Patient verbalized understanding and agreed to plan.   ____________________________________________   FINAL CLINICAL IMPRESSION(S) / ED DIAGNOSES  Final diagnoses:  Viral URI with cough     ED Discharge Orders    None       Note: This dictation was prepared with Dragon dictation along with smaller phrase technology. Any transcriptional errors that result from this process are unintentional.         Renford DillsMiller, Jaimarie Rapozo, NP 04/16/18 1208

## 2018-04-16 NOTE — Discharge Instructions (Addendum)
Over the counter as needed. Rest. Drink plenty of fluids.  ° °Follow up with your primary care physician this week as needed. Return to Urgent care for new or worsening concerns.  ° °

## 2018-04-16 NOTE — ED Triage Notes (Signed)
Patient c/o sinus congestion, pressure and HAs for the past 3 days.

## 2019-02-15 ENCOUNTER — Ambulatory Visit
Admission: EM | Admit: 2019-02-15 | Discharge: 2019-02-15 | Disposition: A | Discharge status: Home / Self Care | Payer: BLUE CROSS/BLUE SHIELD | Attending: Urgent Care | Admitting: Urgent Care

## 2019-02-15 ENCOUNTER — Other Ambulatory Visit: Payer: Self-pay

## 2019-02-15 DIAGNOSIS — F10120 Alcohol abuse with intoxication, uncomplicated: Secondary | ICD-10-CM | POA: Diagnosis not present

## 2019-02-15 DIAGNOSIS — Z7189 Other specified counseling: Secondary | ICD-10-CM | POA: Diagnosis not present

## 2019-02-15 DIAGNOSIS — Z20828 Contact with and (suspected) exposure to other viral communicable diseases: Secondary | ICD-10-CM

## 2019-02-15 DIAGNOSIS — Z20822 Contact with and (suspected) exposure to covid-19: Secondary | ICD-10-CM

## 2019-02-15 MED ORDER — ONDANSETRON 4 MG PO TBDP: 4.0000 mg | ORAL_TABLET | Freq: Three times a day (TID) | ORAL | 0 refills | Status: DC | PRN

## 2019-02-15 MED ORDER — ONDANSETRON 8 MG PO TBDP
8.0000 mg | ORAL_TABLET | Freq: Once | ORAL | Status: AC
  Administered 2019-02-15: 8 mg via ORAL

## 2019-02-15 NOTE — ED Triage Notes (Signed)
Patient states that he went to a wedding last night and does not drink but decided to drink last night. Patient states that he has been vomiting all night and feels terrible.

## 2019-02-15 NOTE — ED Provider Notes (Addendum)
[place], [place]   Name: Michael Jenkins DOB: Dec 12, 1985 MRN: [id number] CSN: [id number] PCP: Patient, No Pcp Per  Arrival date and time:  02/15/19 1107  Chief Complaint:  Nausea   NOTE: Prior to seeing the patient today, I have reviewed the triage nursing documentation and vital signs. Clinical staff has updated patient's PMH/PSHx, current medication list, and drug allergies/intolerances to ensure comprehensive history available to assist in medical decision making.   History:   HPI: Michael Jenkins is a 33 y.o. male who presents today with complaints of nausea and vomiting. Patient reports that he consumed an unknown amount of alcohol (moonshine) yesterday while at a wedding. Patient states, "I never drink, but I drank a lot yesterday". Patient reporting that he was up all night vomiting. He went in to work this morning at the [employer], however had to end up leaving due to continued nausea and vomiting. Patient states, "I feel miserable. I am fatigued and I just feel dehydrated". Patient has a mild generalized headache. He has no other symptoms today. Patient ambulatory into clinic with an observed steady gait after he drove himself here today.   Additionally, patient is requesting information regarding current presentations and trends being seen with regards to SARS-CoV-2 (novel coronavirus). Patient is employed in the Medical laboratory scientific officer and reports that they are receiving very little information about how SARS-CoV-2 has evolved, in terms of symptom constellation, since the pandemic began. Patient has no symptoms, however given the fact that he attended the wedding yesterday, he is requesting to be tested today.   Past Medical History:  Diagnosis Date   Arrhythmia    Chronic back pain    Irregular heartbeat     Past Surgical History:  Procedure Laterality Date   COLONOSCOPY WITH PROPOFOL N/A 08/22/2016   Procedure: COLONOSCOPY WITH PROPOFOL;  Surgeon: Robert Bellow, MD;  Location: [facility] ENDOSCOPY;  Service: Endoscopy;  Laterality: N/A;   HERNIA REPAIR  [id number], [id number]   umbilical    KNEE SURGERY      Family History  Problem Relation Age of Onset   Colon cancer Mother     Social History   Tobacco Use   Smoking status: Never Smoker   Smokeless tobacco: Never Used  Substance Use Topics   Alcohol use: Yes    Comment: rare   Drug use: No    Patient Active Problem List   Diagnosis Date Noted   Rectal bleeding 06/08/2016    Home Medications:    No outpatient medications have been marked as taking for the 02/15/19 encounter [facility] Encounter).    Allergies:   Medrol [methylprednisolone]  Review of Systems (ROS): Review of Systems  Constitutional: Positive for fatigue. Negative for fever.  HENT: Negative for congestion, ear pain, postnasal drip, rhinorrhea, sinus pressure, sinus pain, sneezing and sore throat.   Eyes: Negative for pain, discharge and redness.  Respiratory: Negative for cough, chest tightness and shortness of breath.   Cardiovascular: Negative for chest pain and palpitations.  Gastrointestinal: Positive for nausea and vomiting. Negative for abdominal pain and diarrhea.  Musculoskeletal: Negative for arthralgias, back pain, myalgias and neck pain.  Skin: Negative for color change, pallor and rash.  Neurological: Positive for weakness (generalized) and headaches. Negative for dizziness and syncope.  Hematological: Negative for adenopathy.  Psychiatric/Behavioral: Positive for sleep disturbance (2/2 acute N/V).     Vital Signs: Today's Vitals   02/15/19 1119 02/15/19 1121 02/15/19 1148 02/15/19 1201  BP:  130/90  Pulse:  82    Resp:  16    Temp:  98.7 F (37.1 C)    TempSrc:  Oral    SpO2:  100%    Weight: 190 lb (86.2 kg)     Height: '5\' 7"'$  (1.702 m)     PainSc: '2   2  2     '$ Physical Exam: Physical Exam  Constitutional: He is oriented to person, place, and time. No distress.  Acutely  weak/listless appearing  HENT:  Head: Normocephalic and atraumatic.  Nose: Nose normal.  Mouth/Throat: Oropharynx is clear and moist.  Eyes: Pupils are equal, round, and reactive to light. Conjunctivae and EOM are normal.  Neck: Normal range of motion. Neck supple. No tracheal deviation present.  Cardiovascular: Normal rate, regular rhythm, normal heart sounds and intact distal pulses. Exam reveals no gallop and no friction rub.  No murmur heard. Pulmonary/Chest: Effort normal and breath sounds normal. No respiratory distress. He has no wheezes. He has no rales.  Abdominal: Soft. Bowel sounds are normal. He exhibits no distension. There is no abdominal tenderness.  Musculoskeletal: Normal range of motion.  Neurological: He is alert and oriented to person, place, and time. He displays weakness (generalized). Gait normal.  Skin: Skin is warm and dry. No rash noted. He is not diaphoretic.  Psychiatric: Mood, memory, affect and judgment normal.  Nursing note and vitals reviewed.   Urgent Care Treatments / Results:   LABS: PLEASE NOTE: all labs that were ordered this encounter are listed, however only abnormal results are displayed. Labs Reviewed  NOVEL CORONAVIRUS, NAA (HOSP ORDER, SEND-OUT TO REF LAB; TAT 18-24 HRS)    EKG: -None  RADIOLOGY: No results found.  PROCEDURES: Procedures  MEDICATIONS RECEIVED THIS VISIT: Medications  ondansetron (ZOFRAN-ODT) disintegrating tablet 8 mg (8 mg Oral Given 02/15/19 1129)    PERTINENT CLINICAL COURSE NOTES/UPDATES:   Initial Impression / Assessment and Plan / Urgent Care Course:  Pertinent labs & imaging results that were available during my care of the patient were personally reviewed by me and considered in my medical decision making (see lab/imaging section of note for values and interpretations).  Michael Jenkins is a 33 y.o. male who presents to [facility] today with complaints of nausea and vomiting and requests of  SARS-CoV-2 (novel coronavirus) testing.   Patient is acutely weak and listless appearing overall in clinic today. He does not appear to be in any acute distress. Presenting symptoms (see HPI) and exam as documented above. Symptoms consistent with hangover following ingestion of an unknown amount of ETOH yesterday at a wedding. Patient with headache and generalized weakness secondary to acute fluid and electrolyte losses. VSS; no tachycardia or hypotension. He is not orthostatic. Oral ondansetron given in clinic, which improved his symptoms. Patient encouraged to ncrease fluid intake as much as possible, with water and ORS being the best options, in order to mitigate losses. Patient advised to rest. Will provide a supply of ondansetron for patient to use over the course of the next few days on a PRN basis. Patient asking how long his symptoms will last. I advised that I am not able to advise as there are many unknown factors including ETOH concentration, amount ingestion, and time of last drink. I reviewed that there are many physiological factors that influence ETOH metabolism (age, weight, rate of metabolism, GFR) as well. Due to the myriad of unknown variables, duration of symptoms is unable to be determined at this time.   Patient  presents today concerned about SARS-CoV-2 (novel coronavirus). He is requesting testing today while he is here. Discussed typical symptom constellation; patient is asymptomatic. In light of patient's potential for occupational exposure, and recent attendance to a large group gathering (wedding), testing is reasonable. SARS-CoV-2 swab collected by certified clinical staff. Discussed variable turn around times associated with testing, as swabs are being processed at [facility], and have been between 2-5 days to come back. He was advised to self quarantine, per [facility] DHHS guidelines, until negative results received.   Discussed follow up with primary care physician should he develop any  concerning symptoms. I have reviewed the follow up and strict return precautions for any new or worsening symptoms. Patient is aware of symptoms that would be deemed urgent/emergent, and would thus require further evaluation either here or in the emergency department. At the time of discharge, he verbalized understanding and consent with the discharge plan as it was reviewed with him. All questions were fielded by provider and/or clinic staff prior to patient discharge.    Final Clinical Impressions / Urgent Care Diagnoses:   Final diagnoses:  Hangover without complication ([place])  Encounter for laboratory testing for COVID-19 virus  Advice given about COVID-19 virus infection    New Prescriptions:  [place] Controlled Substance Registry consulted? Not Applicable  Meds ordered this encounter  Medications   ondansetron (ZOFRAN-ODT) disintegrating tablet 8 mg   ondansetron (ZOFRAN-ODT) 4 MG disintegrating tablet    Sig: Take 1 tablet (4 mg total) by mouth every 8 (eight) hours as needed.    Dispense:  12 tablet    Refill:  0    Recommended Follow up Care:  Patient encouraged to follow up with the following provider within the specified time frame, or sooner as dictated by the severity of his symptoms. As always, he was instructed that for any urgent/emergent care needs, he should seek care either here or in the emergency department for more immediate evaluation.  NOTE: This note was prepared using Lobbyist along with smaller Company secretary. Despite my best ability to proofread, there is the potential that transcriptional errors may still occur from this process, and are completely unintentional.     Karen Kitchens, NP 02/15/19 2352

## 2019-02-15 NOTE — Discharge Instructions (Addendum)
It was very nice seeing you today in clinic. Thank you for entrusting me with your care.   Use nausea medication. Rest and increase fluid intake as much as possible. Water is always best, as sugar and caffeine containing fluids can cause you to become dehydrated. Try to incorporate electrolyte enriched fluids, such as Gatorade or Pedialyte, into your fluid intake.    Make arrangements to follow up with your regular doctor for re-evaluation if not improving. If your symptoms/condition worsens, please seek follow up care either here or in the ER. Please remember, our [place] providers are "right here with you" when you need [place].   Again, it was my pleasure to take care of you today. Thank you for choosing our clinic. I hope that you start to feel better quickly.   Honor Loh, MSN, APRN, FNP-C, CEN Advanced Practice Provider [facility]

## 2019-02-16 LAB — NOVEL CORONAVIRUS, NAA (HOSP ORDER, SEND-OUT TO REF LAB; TAT 18-24 HRS): SARS-CoV-2, NAA: NOT DETECTED

## 2019-08-23 ENCOUNTER — Encounter (HOSPITAL_COMMUNITY): Payer: Self-pay | Admitting: Emergency Medicine

## 2019-08-23 ENCOUNTER — Emergency Department (HOSPITAL_COMMUNITY)
Admission: EM | Admit: 2019-08-23 | Discharge: 2019-08-23 | Disposition: A | Discharge status: Home / Self Care | Payer: BC Managed Care – PPO | Attending: Emergency Medicine | Admitting: Emergency Medicine

## 2019-08-23 ENCOUNTER — Other Ambulatory Visit: Payer: Self-pay

## 2019-08-23 ENCOUNTER — Emergency Department (HOSPITAL_COMMUNITY): Payer: BC Managed Care – PPO

## 2019-08-23 DIAGNOSIS — R0789 Other chest pain: Secondary | ICD-10-CM | POA: Insufficient documentation

## 2019-08-23 DIAGNOSIS — R079 Chest pain, unspecified: Secondary | ICD-10-CM | POA: Diagnosis present

## 2019-08-23 LAB — CBC WITH DIFFERENTIAL/PLATELET
Abs Immature Granulocytes: 0.01 10*3/uL (ref 0.00–0.07)
Basophils Absolute: 0 10*3/uL (ref 0.0–0.1)
Basophils Relative: 1 %
Eosinophils Absolute: 0.1 10*3/uL (ref 0.0–0.5)
Eosinophils Relative: 1 %
HCT: 51.9 % (ref 39.0–52.0)
Hemoglobin: 17.3 g/dL — ABNORMAL HIGH (ref 13.0–17.0)
Immature Granulocytes: 0 %
Lymphocytes Relative: 37 %
Lymphs Abs: 2 10*3/uL (ref 0.7–4.0)
MCH: 30.3 pg (ref 26.0–34.0)
MCHC: 33.3 g/dL (ref 30.0–36.0)
MCV: 90.9 fL (ref 80.0–100.0)
Monocytes Absolute: 0.5 10*3/uL (ref 0.1–1.0)
Monocytes Relative: 9 %
Neutro Abs: 2.9 10*3/uL (ref 1.7–7.7)
Neutrophils Relative %: 52 %
Platelets: 195 10*3/uL (ref 150–400)
RBC: 5.71 MIL/uL (ref 4.22–5.81)
RDW: 12 % (ref 11.5–15.5)
WBC: 5.5 10*3/uL (ref 4.0–10.5)
nRBC: 0 % (ref 0.0–0.2)

## 2019-08-23 LAB — COMPREHENSIVE METABOLIC PANEL
ALT: 55 U/L — ABNORMAL HIGH (ref 0–44)
AST: 25 U/L (ref 15–41)
Albumin: 4.2 g/dL (ref 3.5–5.0)
Alkaline Phosphatase: 70 U/L (ref 38–126)
Anion gap: 10 (ref 5–15)
BUN: 15 mg/dL (ref 6–20)
CO2: 25 mmol/L (ref 22–32)
Calcium: 9.3 mg/dL (ref 8.9–10.3)
Chloride: 104 mmol/L (ref 98–111)
Creatinine, Ser: 0.87 mg/dL (ref 0.61–1.24)
GFR calc Af Amer: >60 mL/min (ref >60)
GFR calc non Af Amer: >60 mL/min (ref >60)
Glucose, Bld: 115 mg/dL — ABNORMAL HIGH (ref 70–99)
Potassium: 4.3 mmol/L (ref 3.5–5.1)
Sodium: 139 mmol/L (ref 135–145)
Total Bilirubin: 0.6 mg/dL (ref 0.3–1.2)
Total Protein: 7 g/dL (ref 6.5–8.1)

## 2019-08-23 LAB — TROPONIN I (HIGH SENSITIVITY)
Troponin I (High Sensitivity): 3 ng/L (ref <18)
Troponin I (High Sensitivity): 2 ng/L (ref <18)

## 2019-08-23 LAB — LIPASE, BLOOD: Lipase: 27 U/L (ref 11–51)

## 2019-08-23 LAB — D-DIMER, QUANTITATIVE: D-Dimer, Quant: <0.27 ug/mL-FEU (ref 0.00–0.50)

## 2019-08-23 MED ORDER — KETOROLAC TROMETHAMINE 30 MG/ML IJ SOLN
30.0000 mg | Freq: Once | INTRAMUSCULAR | Status: AC
  Administered 2019-08-23: 07:00:00 30 mg via INTRAVENOUS
  Filled 2019-08-23: qty 1

## 2019-08-23 MED ORDER — ALUM & MAG HYDROXIDE-SIMETH 200-200-20 MG/5ML PO SUSP
30.0000 mL | Freq: Once | ORAL | Status: AC
  Administered 2019-08-23: 30 mL via ORAL
  Filled 2019-08-23: qty 30

## 2019-08-23 MED ORDER — LIDOCAINE VISCOUS HCL 2 % MT SOLN
15.0000 mL | Freq: Once | OROMUCOSAL | Status: AC
  Administered 2019-08-23: 08:00:00 15 mL via ORAL
  Filled 2019-08-23: qty 15

## 2019-08-23 MED ORDER — DICLOFENAC SODIUM 75 MG PO TBEC: 75.0000 mg | DELAYED_RELEASE_TABLET | Freq: Two times a day (BID) | ORAL | 0 refills | Status: DC

## 2019-08-23 MED ORDER — METHOCARBAMOL 500 MG PO TABS: 500.0000 mg | ORAL_TABLET | Freq: Four times a day (QID) | ORAL | 0 refills | Status: DC

## 2019-08-23 NOTE — ED Provider Notes (Signed)
[facility] EMERGENCY DEPARTMENT Provider Note   CSN: [id number] Arrival date & time: 08/23/19  0551     History Chief Complaint  Patient presents with  . Chest Pain    Michael Jenkins is a 34 y.o. male.  Patient with a 3-day history of central chest pain that radiates to his right chest and mid back.  He states the pain is fairly constant.  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  He took some Flexeril without relief.  This morning he felt more short of breath so he wanted to be evaluated.  The pain has been constant for the past 3 days and radiates to his right mid back.  He feels short of breath now has some pain with breathing.  There is no cough or fever.  No nausea, vomiting, diarrhea or diaphoresis.  No history of cardiac issues.  No leg pain or leg swelling.  He does not smoke or use any illicit drugs. The pain is worse with movement of his right arm and palpation.  The history is provided by the patient.  Chest Pain      Past Medical History:  Diagnosis Date  . Arrhythmia   . Chronic back pain   . Irregular heartbeat     Patient Active Problem List   Diagnosis Date Noted  . Rectal bleeding 06/08/2016    Past Surgical History:  Procedure Laterality Date  . COLONOSCOPY WITH PROPOFOL N/A 08/22/2016   Procedure: COLONOSCOPY WITH PROPOFOL;  Surgeon: Earline Mayotte, MD;  Location: [facility] ENDOSCOPY;  Service: Endoscopy;  Laterality: N/A;  . HERNIA REPAIR  1986, 2005   umbilical   . KNEE SURGERY         Family History  Problem Relation Age of Onset  . Colon cancer Mother     Social History   Tobacco Use  . Smoking status: Never Smoker  . Smokeless tobacco: Never Used  Substance Use Topics  . Alcohol use: Yes    Comment: rare  . Drug use: No    Home Medications Prior to Admission medications   Medication Sig Start Date End Date Taking? Authorizing Provider  ondansetron  (ZOFRAN-ODT) 4 MG disintegrating tablet Take 1 tablet (4 mg total) by mouth every 8 (eight) hours as needed. 02/15/19   Verlee Monte, NP    Allergies    Medrol [methylprednisolone]  Review of Systems   Review of Systems  Cardiovascular: Positive for chest pain.    Physical Exam Updated Vital Signs BP 119/81   Pulse 85   Temp 97.9 F (36.6 C) (Oral)   Resp 18   Ht 5\' 7"  (1.702 m)   Wt 88.5 kg   SpO2 98%   BMI 30.54 kg/m   Physical Exam Vitals and nursing note reviewed.  Constitutional:      General: He is not in acute distress.    Appearance: He is well-developed.  HENT:     Head: Normocephalic and atraumatic.     Mouth/Throat:     Pharynx: No oropharyngeal exudate.  Eyes:     Conjunctiva/sclera: Conjunctivae normal.     Pupils: Pupils are equal, round, and reactive to light.  Neck:     Comments: No meningismus. Cardiovascular:     Rate and Rhythm: Normal rate and regular rhythm.     Heart sounds: Normal heart sounds. No murmur.  Pulmonary:     Effort: Pulmonary effort  is normal. No respiratory distress.     Breath sounds: Normal breath sounds.     Comments: No rash Chest:     Chest wall: Tenderness present.  Abdominal:     Palpations: Abdomen is soft.     Tenderness: There is no abdominal tenderness. There is no guarding or rebound.  Musculoskeletal:        General: No tenderness. Normal range of motion.     Cervical back: Normal range of motion and neck supple.  Skin:    General: Skin is warm.  Neurological:     Mental Status: He is alert and oriented to person, place, and time.     Cranial Nerves: No cranial nerve deficit.     Motor: No abnormal muscle tone.     Coordination: Coordination normal.     Comments:  5/5 strength throughout. CN 2-12 intact.Equal grip strength.   Psychiatric:        Behavior: Behavior normal.     ED Results / Procedures / Treatments   Labs (all labs ordered are listed, but only abnormal results are displayed) Labs  Reviewed  CBC WITH DIFFERENTIAL/PLATELET - Abnormal; Notable for the following components:      Result Value   Hemoglobin 17.3 (*)    All other components within normal limits  COMPREHENSIVE METABOLIC PANEL - Abnormal; Notable for the following components:   Glucose, Bld 115 (*)    ALT 55 (*)    All other components within normal limits  LIPASE, BLOOD  D-DIMER, QUANTITATIVE (NOT AT [facility])  TROPONIN I (HIGH SENSITIVITY)  TROPONIN I (HIGH SENSITIVITY)    EKG EKG Interpretation  Date/Time:  Sunday August 23 2019 05:56:52 EDT Ventricular Rate:  74 PR Interval:    QRS Duration: 88 QT Interval:  368 QTC Calculation: 409 R Axis:   43 Text Interpretation: Sinus rhythm No significant change was found Confirmed by Ezequiel Essex [phone]) on 08/23/2019 6:02:24 AM   Radiology DG Chest Portable 1 View  Result Date: 08/23/2019 CLINICAL DATA:  Chest pain EXAM: PORTABLE CHEST 1 VIEW COMPARISON:  05/05/2016 FINDINGS: The heart size and mediastinal contours are within normal limits. Both lungs are clear. The visualized skeletal structures are unremarkable. IMPRESSION: No active disease. Electronically Signed   By: Ulyses Jarred M.D.   On: 08/23/2019 06:38    Procedures Procedures (including critical care time)  Medications Ordered in ED Medications  ketorolac (TORADOL) 30 MG/ML injection 30 mg (30 mg Intravenous Given 08/23/19 0657)  alum & mag hydroxide-simeth (MAALOX/MYLANTA) 200-200-20 MG/5ML suspension 30 mL (30 mLs Oral Given 08/23/19 0749)    And  lidocaine (XYLOCAINE) 2 % viscous mouth solution 15 mL (15 mLs Oral Given 08/23/19 0749)    ED Course  I have reviewed the triage vital signs and the nursing notes.  Pertinent labs & imaging results that were available during my care of the patient were reviewed by me and considered in my medical decision making (see chart for details).    MDM Rules/Calculators/A&P                      3 days of constant chest pain radiating into the  mid back worse on the right side.  There were some shortness of breath earlier but this was resolved.  No cough or fever.  EKG is sinus rhythm.  Chest x-ray is negative, troponin and D-dimer are negative.  Patient's pain is improved with Toradol and GI cocktail. CXR with normal mediastinum. D-dimer negative.  Equal upper extremity blood pressures, radial pulses, and grip strengths.  Low suspicion for aortic dissection or PE.  Chest pain atypical for ACS with negative troponin after 3 days of constant pain. Suspect likely musculoskeletal pain.  Second troponin pending.  Care transferred to [facility] at shift change.  Final Clinical Impression(s) / ED Diagnoses Final diagnoses:  None    Rx / DC Orders ED Discharge Orders    None       Kenry Daubert, Jeannett Senior, MD 08/23/19 [phone]

## 2019-08-23 NOTE — ED Notes (Signed)
Prescriptions called to [facility]. Pharmacy not opened

## 2019-08-23 NOTE — ED Provider Notes (Signed)
Pt's troponin negative x 2.  Pt reevaluated and is feeling better.  Pt counseled on follow up  Pt given rx for voltaren and robaxin  An After Visit Summary was printed and given to the patient.    Elson Areas, [place] 08/23/19 1004    Geoffery Lyons, MD 08/23/19 [phone]

## 2019-08-23 NOTE — Discharge Instructions (Signed)
Return if any problems.  Follow up with your Physicain for recheck  

## 2019-08-23 NOTE — ED Triage Notes (Signed)
Pt arrives to ED w/complaints of central chest pain x 3 days that radiates to back and throughout left arm. Pt states he took flexiril w/out relief. Pt complains of SOB, denies NVD, NAD

## 2019-08-23 NOTE — ED Notes (Signed)
Bilateral BP done on left arm 126/88 and on right arm 124/84.

## 2020-01-15 ENCOUNTER — Emergency Department (HOSPITAL_COMMUNITY): Payer: BC Managed Care – PPO

## 2020-01-15 ENCOUNTER — Encounter (HOSPITAL_COMMUNITY): Payer: Self-pay

## 2020-01-15 ENCOUNTER — Other Ambulatory Visit: Payer: Self-pay

## 2020-01-15 ENCOUNTER — Emergency Department (HOSPITAL_COMMUNITY)
Admission: EM | Admit: 2020-01-15 | Discharge: 2020-01-15 | Disposition: A | Discharge status: Home / Self Care | Payer: BC Managed Care – PPO | Attending: Emergency Medicine | Admitting: Emergency Medicine

## 2020-01-15 DIAGNOSIS — Z79899 Other long term (current) drug therapy: Secondary | ICD-10-CM | POA: Diagnosis not present

## 2020-01-15 DIAGNOSIS — M5440 Lumbago with sciatica, unspecified side: Secondary | ICD-10-CM

## 2020-01-15 DIAGNOSIS — M549 Dorsalgia, unspecified: Secondary | ICD-10-CM | POA: Diagnosis present

## 2020-01-15 MED ORDER — KETOROLAC TROMETHAMINE 30 MG/ML IJ SOLN
30.0000 mg | Freq: Once | INTRAMUSCULAR | Status: AC
  Administered 2020-01-15: 30 mg via INTRAVENOUS
  Filled 2020-01-15: qty 1

## 2020-01-15 MED ORDER — HYDROMORPHONE HCL 1 MG/ML IJ SOLN
1.0000 mg | Freq: Once | INTRAMUSCULAR | Status: AC
  Administered 2020-01-15: 1 mg via INTRAVENOUS
  Filled 2020-01-15: qty 1

## 2020-01-15 MED ORDER — METHYLPREDNISOLONE SODIUM SUCC 125 MG IJ SOLR
125.0000 mg | Freq: Once | INTRAMUSCULAR | Status: AC
  Administered 2020-01-15: 125 mg via INTRAVENOUS
  Filled 2020-01-15: qty 2

## 2020-01-15 MED ORDER — HYDROCODONE-ACETAMINOPHEN 5-325 MG PO TABS: 1.0000 | ORAL_TABLET | Freq: Four times a day (QID) | ORAL | 0 refills | Status: DC | PRN

## 2020-01-15 MED ORDER — ONDANSETRON HCL 4 MG/2ML IJ SOLN
INTRAMUSCULAR | Status: AC
  Filled 2020-01-15: qty 2

## 2020-01-15 MED ORDER — PREDNISONE 10 MG PO TABS: 20.0000 mg | ORAL_TABLET | Freq: Every day | ORAL | 0 refills | Status: DC

## 2020-01-15 MED ORDER — ONDANSETRON HCL 4 MG/2ML IJ SOLN
4.0000 mg | Freq: Once | INTRAMUSCULAR | Status: AC
  Administered 2020-01-15: 4 mg via INTRAVENOUS

## 2020-01-15 NOTE — ED Provider Notes (Signed)
[facility] EMERGENCY DEPARTMENT Provider Note   CSN: [id number] Arrival date & time: 01/15/20  0631     History Chief Complaint  Patient presents with  . Back Pain    Michael Jenkins is a 34 y.o. male.  Patient complains of left-sided back pain which radiating down his left leg.  Patient has been on multiple laxatives and nonsteroidals without help.  No history of any recent injury  The history is provided by the patient and medical records. No language interpreter was used.  Back Pain Location:  Generalized Quality:  Aching Radiates to: Left upper leg. Pain severity:  Moderate Pain is:  Worse during the day Onset quality:  Gradual Timing:  Constant Progression:  Worsening Chronicity:  New Context: not emotional stress   Associated symptoms: no abdominal pain, no chest pain and no headaches        Past Medical History:  Diagnosis Date  . Arrhythmia   . Chronic back pain   . Irregular heartbeat     Patient Active Problem List   Diagnosis Date Noted  . Rectal bleeding 06/08/2016    Past Surgical History:  Procedure Laterality Date  . COLONOSCOPY WITH PROPOFOL N/A 08/22/2016   Procedure: COLONOSCOPY WITH PROPOFOL;  Surgeon: Earline Mayotte, MD;  Location: [facility] ENDOSCOPY;  Service: Endoscopy;  Laterality: N/A;  . HERNIA REPAIR  1986, 2005   umbilical   . KNEE SURGERY         Family History  Problem Relation Age of Onset  . Colon cancer Mother     Social History   Tobacco Use  . Smoking status: Never Smoker  . Smokeless tobacco: Never Used  Vaping Use  . Vaping Use: Never used  Substance Use Topics  . Alcohol use: Yes    Comment: rare  . Drug use: No    Home Medications Prior to Admission medications   Medication Sig Start Date End Date Taking? Authorizing Provider  diclofenac (VOLTAREN) 75 MG EC tablet Take 1 tablet (75 mg total) by mouth 2 (two) times daily. 08/23/19   Elson Areas, PA-C  HYDROcodone-acetaminophen (NORCO/VICODIN) 5-325  MG tablet Take 1 tablet by mouth every 6 (six) hours as needed. 01/15/20   Bethann Berkshire, MD  methocarbamol (ROBAXIN) 500 MG tablet Take 1 tablet (500 mg total) by mouth 4 (four) times daily. 08/23/19   Elson Areas, PA-C  ondansetron (ZOFRAN-ODT) 4 MG disintegrating tablet Take 1 tablet (4 mg total) by mouth every 8 (eight) hours as needed. 02/15/19   Verlee Monte, NP  predniSONE (DELTASONE) 10 MG tablet Take 2 tablets (20 mg total) by mouth daily. 01/15/20   Bethann Berkshire, MD    Allergies    Medrol [methylprednisolone]  Review of Systems   Review of Systems  Constitutional: Negative for appetite change and fatigue.  HENT: Negative for congestion, ear discharge and sinus pressure.   Eyes: Negative for discharge.  Respiratory: Negative for cough.   Cardiovascular: Negative for chest pain.  Gastrointestinal: Negative for abdominal pain and diarrhea.  Genitourinary: Negative for frequency and hematuria.  Musculoskeletal: Positive for back pain.       Pain radiating down left leg  Skin: Negative for rash.  Neurological: Negative for seizures and headaches.  Psychiatric/Behavioral: Negative for hallucinations.    Physical Exam Updated Vital Signs BP 126/88 (BP Location: Right Arm)   Pulse 81   Temp 98.2 F (36.8 C) (Oral)   Resp 20   Ht 5\' 7"  (1.702 m)  Wt 86.2 kg   SpO2 97%   BMI 29.76 kg/m   Physical Exam Vitals and nursing note reviewed.  Constitutional:      Appearance: He is well-developed.  HENT:     Head: Normocephalic.     Nose: Nose normal.  Eyes:     General: No scleral icterus.    Conjunctiva/sclera: Conjunctivae normal.  Neck:     Thyroid: No thyromegaly.  Cardiovascular:     Rate and Rhythm: Normal rate and regular rhythm.     Heart sounds: No murmur heard.  No friction rub. No gallop.   Pulmonary:     Breath sounds: No stridor. No wheezing or rales.  Chest:     Chest wall: No tenderness.  Abdominal:     General: There is no distension.      Tenderness: There is no abdominal tenderness. There is no rebound.  Musculoskeletal:        General: Normal range of motion.     Cervical back: Neck supple.     Comments: Tender lumbar spine with positive straight leg raise on the left  Lymphadenopathy:     Cervical: No cervical adenopathy.  Skin:    Findings: No erythema or rash.  Neurological:     Mental Status: He is alert and oriented to person, place, and time.     Motor: No abnormal muscle tone.     Coordination: Coordination normal.  Psychiatric:        Behavior: Behavior normal.     ED Results / Procedures / Treatments   Labs (all labs ordered are listed, but only abnormal results are displayed) Labs Reviewed - No data to display  EKG None  Radiology DG Lumbar Spine Complete  Result Date: 01/15/2020 CLINICAL DATA:  Back pain. EXAM: LUMBAR SPINE - COMPLETE 4+ VIEW COMPARISON:  No prior. FINDINGS: Paraspinal soft tissues are unremarkable. Normal bony alignment and mineralization. No acute bony abnormality. No evidence of fracture. Mild aortic atherosclerotic vascular calcification cannot be excluded IMPRESSION: 1.  No acute or focal bony abnormality. 2. Mild aortic atherosclerotic vascular calcification cannot be excluded. Electronically Signed   By: Maisie Fus  Register   On: 01/15/2020 08:55    Procedures Procedures (including critical care time)  Medications Ordered in ED Medications  HYDROmorphone (DILAUDID) injection 1 mg (1 mg Intravenous Given 01/15/20 0818)  methylPREDNISolone sodium succinate (SOLU-MEDROL) 125 mg/2 mL injection 125 mg (125 mg Intravenous Given 01/15/20 0819)  ketorolac (TORADOL) 30 MG/ML injection 30 mg (30 mg Intravenous Given 01/15/20 0034)    ED Course  I have reviewed the triage vital signs and the nursing notes.  Pertinent labs & imaging results that were available during my care of the patient were reviewed by me and considered in my medical decision making (see chart for details).    MDM  Rules/Calculators/A&P                          Patient with lumbar pain and positive straight leg raise on the left.  He will be treated with Vicodin and steroids and follow-up with PCP      This patient presents to the ED for concern of back pain, this involves an extensive number of treatment options, and is a complaint that carries with it a high risk of complications and morbidity.  The differential diagnosis includes musculoskeletal injury with sciatica   Lab Tests:     Medicines ordered:   I ordered medication Solu-Medrol and  Dilaudid  Imaging Studies ordered:   I ordered imaging studies which included lumbar spine  I independently visualized and interpreted imaging which showed unremarkable  Additional history obtained:   Additional history obtained from records  Previous records obtained and reviewed.  Consultations Obtained:     Reevaluation:  After the interventions stated above, I reevaluated the patient and found improved  Critical Interventions:  .   Final Clinical Impression(s) / ED Diagnoses Final diagnoses:  Acute left-sided low back pain with sciatica, sciatica laterality unspecified    Rx / DC Orders ED Discharge Orders         Ordered    predniSONE (DELTASONE) 10 MG tablet  Daily        01/15/20 0955    HYDROcodone-acetaminophen (NORCO/VICODIN) 5-325 MG tablet  Every 6 hours PRN        01/15/20 0955           Bethann Berkshire, MD 01/15/20 1001

## 2020-01-15 NOTE — ED Triage Notes (Signed)
Pt presents to ED with complaints of lower back pain shoots down right leg x 3 days.

## 2020-01-15 NOTE — Discharge Instructions (Addendum)
Rest at home the next for 5 days.  Follow-up with your family doctor or Dr. Romeo Apple next week

## 2020-04-13 ENCOUNTER — Encounter: Payer: Self-pay | Admitting: Emergency Medicine

## 2020-04-13 ENCOUNTER — Other Ambulatory Visit: Payer: Self-pay

## 2020-04-13 ENCOUNTER — Ambulatory Visit
Admission: EM | Admit: 2020-04-13 | Discharge: 2020-04-13 | Disposition: A | Discharge status: Home / Self Care | Payer: BC Managed Care – PPO | Attending: Family Medicine | Admitting: Family Medicine

## 2020-04-13 DIAGNOSIS — R059 Cough, unspecified: Secondary | ICD-10-CM | POA: Diagnosis present

## 2020-04-13 DIAGNOSIS — Z791 Long term (current) use of non-steroidal anti-inflammatories (NSAID): Secondary | ICD-10-CM | POA: Insufficient documentation

## 2020-04-13 DIAGNOSIS — J029 Acute pharyngitis, unspecified: Secondary | ICD-10-CM | POA: Insufficient documentation

## 2020-04-13 DIAGNOSIS — J988 Other specified respiratory disorders: Secondary | ICD-10-CM | POA: Insufficient documentation

## 2020-04-13 DIAGNOSIS — Z7952 Long term (current) use of systemic steroids: Secondary | ICD-10-CM | POA: Diagnosis not present

## 2020-04-13 DIAGNOSIS — B9789 Other viral agents as the cause of diseases classified elsewhere: Secondary | ICD-10-CM | POA: Insufficient documentation

## 2020-04-13 DIAGNOSIS — Z79899 Other long term (current) drug therapy: Secondary | ICD-10-CM | POA: Insufficient documentation

## 2020-04-13 DIAGNOSIS — Z20822 Contact with and (suspected) exposure to covid-19: Secondary | ICD-10-CM | POA: Diagnosis not present

## 2020-04-13 LAB — GROUP A STREP BY PCR: Group A Strep by PCR: NOT DETECTED

## 2020-04-13 LAB — RESP PANEL BY RT-PCR (FLU A&B, COVID) ARPGX2
Influenza A by PCR: NEGATIVE
Influenza B by PCR: NEGATIVE
SARS Coronavirus 2 by RT PCR: NEGATIVE

## 2020-04-13 NOTE — ED Provider Notes (Signed)
[facility]    CSN: [id number] Arrival date & time: 04/13/20  1303      History   Chief Complaint Chief Complaint  Patient presents with  . Sore Throat  . Cough   HPI  34 year old male presents with the above complaints.  Started 2 days ago.  Reports sore throat and cough.  No fever.  No reported sick contacts.  No relieving factors.  No other reported symptoms.  No other complaints.  Past Medical History:  Diagnosis Date  . Arrhythmia   . Chronic back pain   . Irregular heartbeat     Patient Active Problem List   Diagnosis Date Noted  . Rectal bleeding 06/08/2016    Past Surgical History:  Procedure Laterality Date  . COLONOSCOPY WITH PROPOFOL N/A 08/22/2016   Procedure: COLONOSCOPY WITH PROPOFOL;  Surgeon: Earline Mayotte, MD;  Location: [facility] ENDOSCOPY;  Service: Endoscopy;  Laterality: N/A;  . HERNIA REPAIR  1986, 2005   umbilical   . KNEE SURGERY         Home Medications    Prior to Admission medications   Medication Sig Start Date End Date Taking? Authorizing Provider  diclofenac (VOLTAREN) 75 MG EC tablet Take 1 tablet (75 mg total) by mouth 2 (two) times daily. 08/23/19   Elson Areas, PA-C  HYDROcodone-acetaminophen (NORCO/VICODIN) 5-325 MG tablet Take 1 tablet by mouth every 6 (six) hours as needed. 01/15/20   Bethann Berkshire, MD  methocarbamol (ROBAXIN) 500 MG tablet Take 1 tablet (500 mg total) by mouth 4 (four) times daily. 08/23/19   Elson Areas, PA-C  ondansetron (ZOFRAN-ODT) 4 MG disintegrating tablet Take 1 tablet (4 mg total) by mouth every 8 (eight) hours as needed. 02/15/19   Verlee Monte, NP  predniSONE (DELTASONE) 10 MG tablet Take 2 tablets (20 mg total) by mouth daily. 01/15/20   Bethann Berkshire, MD    Family History Family History  Problem Relation Age of Onset  . Colon cancer Mother     Social History Social History   Tobacco Use  . Smoking status: Never Smoker  . Smokeless tobacco: Never Used  Vaping Use  .  Vaping Use: Never used  Substance Use Topics  . Alcohol use: Yes    Comment: rare  . Drug use: No     Allergies   Medrol [methylprednisolone]   Review of Systems Review of Systems  Constitutional: Negative for fever.  HENT: Positive for sore throat.   Respiratory: Positive for cough.    Physical Exam Triage Vital Signs ED Triage Vitals  Enc Vitals Group     BP 04/13/20 1409 (!) 141/87     Pulse Rate 04/13/20 1409 83     Resp 04/13/20 1409 18     Temp 04/13/20 1409 98.3 F (36.8 C)     Temp Source 04/13/20 1409 Oral     SpO2 04/13/20 1409 98 %     Weight 04/13/20 1407 195 lb (88.5 kg)     Height 04/13/20 1407 5\' 7"  (1.702 m)     Head Circumference --      Peak Flow --      Pain Score 04/13/20 1407 0     Pain Loc --      Pain Edu? --      Excl. in GC? --    Updated Vital Signs BP (!) 141/87 (BP Location: Right Arm)   Pulse 83   Temp 98.3 F (36.8 C) (Oral)   Resp 18  Ht 5\' 7"  (1.702 m)   Wt 88.5 kg   SpO2 98%   BMI 30.54 kg/m   Visual Acuity Right Eye Distance:   Left Eye Distance:   Bilateral Distance:    Right Eye Near:   Left Eye Near:    Bilateral Near:     Physical Exam Vitals and nursing note reviewed.  Constitutional:      General: He is not in acute distress.    Appearance: Normal appearance. He is not ill-appearing.  HENT:     Head: Normocephalic and atraumatic.     Mouth/Throat:     Pharynx: Oropharynx is clear. No oropharyngeal exudate or posterior oropharyngeal erythema.  Eyes:     General:        Right eye: No discharge.        Left eye: No discharge.     Conjunctiva/sclera: Conjunctivae normal.  Cardiovascular:     Rate and Rhythm: Normal rate and regular rhythm.     Heart sounds: No murmur heard.   Pulmonary:     Effort: Pulmonary effort is normal.     Breath sounds: Normal breath sounds. No wheezing or rales.  Neurological:     Mental Status: He is alert.  Psychiatric:        Mood and Affect: Mood normal.         Behavior: Behavior normal.    UC Treatments / Results  Labs (all labs ordered are listed, but only abnormal results are displayed) Labs Reviewed  GROUP A STREP BY PCR  RESP PANEL BY RT-PCR (FLU A&B, COVID) ARPGX2    EKG   Radiology No results found.  Procedures Procedures (including critical care time)  Medications Ordered in UC Medications - No data to display  Initial Impression / Assessment and Plan / UC Course  I have reviewed the triage vital signs and the nursing notes.  Pertinent labs & imaging results that were available during my care of the patient were reviewed by me and considered in my medical decision making (see chart for details).    34 year old male presents with a viral respiratory fraction.  Strep negative.  Flu and Covid negative.  Supportive care.  Over-the-counter ibuprofen and cough/cold remedies as needed.  Final Clinical Impressions(s) / UC Diagnoses   Final diagnoses:  Viral respiratory infection     Discharge Instructions     We will call with the results.  Take care  Dr. 31    ED Prescriptions    None     PDMP not reviewed this encounter.   Adriana Simas, Tommie Sams 04/13/20 1915

## 2020-04-13 NOTE — Discharge Instructions (Signed)
We will call with the results.  Take care  Dr. Yamilex Borgwardt  

## 2020-04-13 NOTE — ED Triage Notes (Signed)
Patient c/o sore throat and cough that started 2 days ago. Denies fever.

## 2020-04-26 ENCOUNTER — Other Ambulatory Visit: Payer: Self-pay

## 2020-04-26 ENCOUNTER — Ambulatory Visit
Admission: EM | Admit: 2020-04-26 | Discharge: 2020-04-26 | Disposition: A | Discharge status: Home / Self Care | Payer: BC Managed Care – PPO | Attending: Family Medicine | Admitting: Family Medicine

## 2020-04-26 ENCOUNTER — Encounter: Payer: Self-pay | Admitting: Emergency Medicine

## 2020-04-26 ENCOUNTER — Ambulatory Visit (INDEPENDENT_AMBULATORY_CARE_PROVIDER_SITE_OTHER): Payer: BC Managed Care – PPO

## 2020-04-26 DIAGNOSIS — J111 Influenza due to unidentified influenza virus with other respiratory manifestations: Secondary | ICD-10-CM

## 2020-04-26 DIAGNOSIS — R059 Cough, unspecified: Secondary | ICD-10-CM

## 2020-04-26 DIAGNOSIS — R509 Fever, unspecified: Secondary | ICD-10-CM | POA: Diagnosis not present

## 2020-04-26 DIAGNOSIS — Z20822 Contact with and (suspected) exposure to covid-19: Secondary | ICD-10-CM | POA: Diagnosis not present

## 2020-04-26 DIAGNOSIS — R0981 Nasal congestion: Secondary | ICD-10-CM

## 2020-04-26 DIAGNOSIS — J101 Influenza due to other identified influenza virus with other respiratory manifestations: Secondary | ICD-10-CM | POA: Diagnosis not present

## 2020-04-26 LAB — RESP PANEL BY RT-PCR (FLU A&B, COVID) ARPGX2
Influenza A by PCR: POSITIVE — AB
Influenza B by PCR: NEGATIVE
SARS Coronavirus 2 by RT PCR: NEGATIVE

## 2020-04-26 MED ORDER — XOFLUZA (80 MG DOSE) 2 X 40 MG PO TBPK: 80.0000 mg | ORAL_TABLET | Freq: Once | ORAL | 0 refills | Status: AC

## 2020-04-26 MED ORDER — KETOROLAC TROMETHAMINE 10 MG PO TABS: 10.0000 mg | ORAL_TABLET | Freq: Four times a day (QID) | ORAL | 0 refills | Status: DC | PRN

## 2020-04-26 MED ORDER — HYDROCOD POLST-CPM POLST ER 10-8 MG/5ML PO SUER: 5.0000 mL | Freq: Two times a day (BID) | ORAL | 0 refills | Status: DC | PRN

## 2020-04-26 NOTE — ED Triage Notes (Signed)
Pt c/o body aches, fatigue, cough, nasal congestion, runny nose. Started about 2 weeks ago. He states he has been tested for covid 3 times and was negative.

## 2020-04-26 NOTE — ED Provider Notes (Signed)
[facility]    CSN: [id number] Arrival date & time: 04/26/20  0801      History   Chief Complaint Chief Complaint  Patient presents with  . Cough   HPI  34 year old male presents with respiratory symptoms.  Patient recently seen by me on 12/8.  Had negative Covid and flu testing at the time.  Was treated with supportive care.  Was seen in the ER on 12/12.  Had a negative work-up as well.  Seen by his PCP on 12/13.  Diagnosed with sinusitis.  Treated with doxycycline.  Patient states that he has completed the antibiotic course.  He continues to feel poorly.  He has now developed a fever and is currently febrile at 101.3.  He reports body aches, fatigue, cough, congestion.  Pain 5/10 in severity.  Feels very poorly.  Cough is the predominant symptom.  He is having difficulty sleeping as a result.  Family members also sick.  No other complaints concerns at this time.  Past Medical History:  Diagnosis Date  . Arrhythmia   . Chronic back pain   . Irregular heartbeat    Patient Active Problem List   Diagnosis Date Noted  . Rectal bleeding 06/08/2016   Past Surgical History:  Procedure Laterality Date  . COLONOSCOPY WITH PROPOFOL N/A 08/22/2016   Procedure: COLONOSCOPY WITH PROPOFOL;  Surgeon: Earline Mayotte, MD;  Location: [facility] ENDOSCOPY;  Service: Endoscopy;  Laterality: N/A;  . HERNIA REPAIR  1986, 2005   umbilical   . KNEE SURGERY     Home Medications    Prior to Admission medications   Medication Sig Start Date End Date Taking? Authorizing Provider  Baloxavir Marboxil,80 MG Dose, (XOFLUZA, 80 MG DOSE,) 2 x 40 MG TBPK Take 80 mg by mouth once for 1 dose. 04/26/20 04/26/20  Tommie Sams, DO  chlorpheniramine-HYDROcodone (TUSSIONEX PENNKINETIC ER) 10-8 MG/5ML SUER Take 5 mLs by mouth every 12 (twelve) hours as needed. 04/26/20   Tommie Sams, DO  ketorolac (TORADOL) 10 MG tablet Take 1 tablet (10 mg total) by mouth every 6 (six) hours as needed for moderate  pain or severe pain. 04/26/20   Tommie Sams, DO    Family History Family History  Problem Relation Age of Onset  . Colon cancer Mother     Social History Social History   Tobacco Use  . Smoking status: Never Smoker  . Smokeless tobacco: Never Used  Vaping Use  . Vaping Use: Never used  Substance Use Topics  . Alcohol use: Yes    Comment: rare  . Drug use: No     Allergies   Medrol [methylprednisolone]   Review of Systems Review of Systems Per HPI  Physical Exam Triage Vital Signs ED Triage Vitals  Enc Vitals Group     BP 04/26/20 0813 129/82     Pulse Rate 04/26/20 0813 (!) 112     Resp 04/26/20 0813 18     Temp 04/26/20 0813 (!) 101.3 F (38.5 C)     Temp Source 04/26/20 0813 Oral     SpO2 04/26/20 0813 98 %     Weight 04/26/20 0811 195 lb 1.7 oz (88.5 kg)     Height 04/26/20 0811 5\' 7"  (1.702 m)     Head Circumference --      Peak Flow --      Pain Score 04/26/20 0811 5     Pain Loc --      Pain Edu? --  Excl. in GC? --    Updated Vital Signs BP 129/82 (BP Location: Left Arm)   Pulse (!) 112   Temp (!) 101.3 F (38.5 C) (Oral) Comment: he has not taken anything for fever  Resp 18   Ht 5\' 7"  (1.702 m)   Wt 88.5 kg   SpO2 98%   BMI 30.56 kg/m   Visual Acuity Right Eye Distance:   Left Eye Distance:   Bilateral Distance:    Right Eye Near:   Left Eye Near:    Bilateral Near:     Physical Exam Constitutional:      Comments: Appears mildly ill but in no acute distress.  HENT:     Head: Normocephalic and atraumatic.  Eyes:     General:        Right eye: No discharge.        Left eye: No discharge.     Conjunctiva/sclera: Conjunctivae normal.  Cardiovascular:     Rate and Rhythm: Regular rhythm. Tachycardia present.  Pulmonary:     Effort: Pulmonary effort is normal.     Breath sounds: Wheezing present.  Neurological:     Mental Status: He is alert.  Psychiatric:        Mood and Affect: Mood normal.        Behavior: Behavior  normal.    UC Treatments / Results  Labs (all labs ordered are listed, but only abnormal results are displayed) Labs Reviewed  RESP PANEL BY RT-PCR (FLU A&B, COVID) ARPGX2 - Abnormal; Notable for the following components:      Result Value   Influenza A by PCR POSITIVE (*)    All other components within normal limits    EKG   Radiology DG Chest 2 View  Result Date: 04/26/2020 CLINICAL DATA:  Cough and fever. Nasal congestion. Symptoms began 2 weeks ago. EXAM: CHEST - 2 VIEW COMPARISON:  One-view chest x-ray 08/23/2019 FINDINGS: Heart size is normal. Lungs are clear. No edema or effusion is present. Axial skeleton is unremarkable. IMPRESSION: Negative two view chest. Electronically Signed   By: 08/25/2019 M.D.   On: 04/26/2020 08:47    Procedures Procedures (including critical care time)  Medications Ordered in UC Medications - No data to display  Initial Impression / Assessment and Plan / UC Course  I have reviewed the triage vital signs and the nursing notes.  Pertinent labs & imaging results that were available during my care of the patient were reviewed by me and considered in my medical decision making (see chart for details).    34 year old male presents with influenza A.  Test was positive today.  Covid negative.  Chest x-ray was obtained and was independently reviewed by me.  I discussed his chest x-ray with radiology.  There is no discrete infiltrate.  He does have a prominent pericardial fat pad noted on lateral view.  Treating patient with Toradol for body aches, Tussionex for cough, and Xofluza for influenza.  Final Clinical Impressions(s) / UC Diagnoses   Final diagnoses:  Influenza  Cough   Discharge Instructions   None    ED Prescriptions    Medication Sig Dispense Auth. Provider   ketorolac (TORADOL) 10 MG tablet Take 1 tablet (10 mg total) by mouth every 6 (six) hours as needed for moderate pain or severe pain. 20 tablet [place], Shelise Maron G, DO    chlorpheniramine-HYDROcodone (TUSSIONEX PENNKINETIC ER) 10-8 MG/5ML SUER Take 5 mLs by mouth every 12 (twelve) hours as needed. 115 mL  Audri Kozub G, DO   Baloxavir Marboxil,80 MG Dose, (XOFLUZA, 80 MG DOSE,) 2 x 40 MG TBPK Take 80 mg by mouth once for 1 dose. 2 each Tommie Sams, DO     PDMP not reviewed this encounter.   Everlene Other [place], [place] 04/26/20 [phone]

## 2020-06-22 ENCOUNTER — Other Ambulatory Visit: Payer: Self-pay

## 2020-06-22 ENCOUNTER — Ambulatory Visit (INDEPENDENT_AMBULATORY_CARE_PROVIDER_SITE_OTHER): Payer: BC Managed Care – PPO

## 2020-06-22 ENCOUNTER — Ambulatory Visit
Admission: EM | Admit: 2020-06-22 | Discharge: 2020-06-22 | Disposition: A | Discharge status: Home / Self Care | Payer: BC Managed Care – PPO | Attending: Emergency Medicine | Admitting: Emergency Medicine

## 2020-06-22 ENCOUNTER — Encounter: Payer: Self-pay | Admitting: Emergency Medicine

## 2020-06-22 DIAGNOSIS — R0602 Shortness of breath: Secondary | ICD-10-CM

## 2020-06-22 DIAGNOSIS — J069 Acute upper respiratory infection, unspecified: Secondary | ICD-10-CM | POA: Insufficient documentation

## 2020-06-22 DIAGNOSIS — R059 Cough, unspecified: Secondary | ICD-10-CM | POA: Diagnosis not present

## 2020-06-22 DIAGNOSIS — Z20822 Contact with and (suspected) exposure to covid-19: Secondary | ICD-10-CM | POA: Diagnosis not present

## 2020-06-22 DIAGNOSIS — U071 COVID-19: Secondary | ICD-10-CM | POA: Insufficient documentation

## 2020-06-22 DIAGNOSIS — R091 Pleurisy: Secondary | ICD-10-CM | POA: Insufficient documentation

## 2020-06-22 LAB — SARS CORONAVIRUS 2 (TAT 6-24 HRS): SARS Coronavirus 2: POSITIVE — AB

## 2020-06-22 MED ORDER — BENZONATATE 200 MG PO CAPS: 200.0000 mg | ORAL_CAPSULE | Freq: Three times a day (TID) | ORAL | 0 refills | Status: DC | PRN

## 2020-06-22 MED ORDER — PSEUDOEPHEDRINE HCL ER 120 MG PO TB12: 120.0000 mg | ORAL_TABLET | Freq: Two times a day (BID) | ORAL | 0 refills | Status: DC

## 2020-06-22 NOTE — Discharge Instructions (Addendum)
If your covid test is positive you need to quarantine for 5 days and then wear a mask for 5 more days even around family.  Here is your xray report: LINICAL DATA:  Back pain, shortness of breath, cough   EXAM: CHEST - 2 VIEW   COMPARISON:  04/26/2020   FINDINGS: The heart size and mediastinal contours are within normal limits. Both lungs are clear. The visualized skeletal structures are unremarkable.   IMPRESSION: Negative.     Electronically Signed   By: Charlett Nose M.D.   On: 06/22/2020 12:28  If your Covid test ends up positive you may take the following supplements to help your immune system be stronger to fight this viral infection Take Quarcetin 500 mg three times a day x 7 days with Zinc 50 mg ones a day x 7 days. The quarcetin is an antiviral and anti-inflammatory supplement which helps open the zinc channels in the cell to absorb Zinc. Zinc helps decrease the virus load in your body. Take Melatonin 6-10 mg at bed time which also helps support your immune system.  Also make sure to take Vit D 5,000 IU per day with a fatty meal and Vit C 5000 mg a day until you are completely better. To prevent viral illnesses your vitamin D should be between 60-80. Stay on Vitamin D 2,000  and C  1000 mg the rest of the season.  Don't lay around, keep active and walk as much as you are able to to prevent worsening of your symptoms.  Follow up with your family Dr next week.  If you get short of breath and you are able to check  your oxygen with a pulse oxygen meter, if it gets to 92% or less, you need to go to the hospital to be admitted. If you dont have one, come back here and we will assess you.

## 2020-06-22 NOTE — ED Provider Notes (Signed)
[facility]    CSN: [id number] Arrival date & time: 06/22/20  1829      History   Chief Complaint Chief Complaint  Patient presents with  . Nasal Congestion  . Facial Pain  . Shortness of Breath  . Cough    HPI Michael Jenkins is a 35 y.o. male who presents with nose congestion, cough,  X 6 days and feeling he cant take deep breaths. Denies fever. Denies having anyone coughing or spitting up on him from work since he works for the [employer]. Denies getting SOB or CP. Has had R mid chest pain off and on and worse with deep breaths at time.  Has had Covid injections   Past Medical History:  Diagnosis Date  . Arrhythmia   . Chronic back pain   . Irregular heartbeat     Patient Active Problem List   Diagnosis Date Noted  . Rectal bleeding 06/08/2016    Past Surgical History:  Procedure Laterality Date  . COLONOSCOPY WITH PROPOFOL N/A 08/22/2016   Procedure: COLONOSCOPY WITH PROPOFOL;  Surgeon: Earline Mayotte, MD;  Location: [facility] ENDOSCOPY;  Service: Endoscopy;  Laterality: N/A;  . HERNIA REPAIR  1986, 2005   umbilical   . KNEE SURGERY      Home Medications    Prior to Admission medications   Medication Sig Start Date End Date Taking? Authorizing Provider  benzonatate (TESSALON) 200 MG capsule Take 1 capsule (200 mg total) by mouth 3 (three) times daily as needed for cough. 06/22/20  Yes Rodriguez-Southworth, Nettie Elm, PA-C  pseudoephedrine (SUDAFED 12 HOUR) 120 MG 12 hr tablet Take 1 tablet (120 mg total) by mouth 2 (two) times daily. For stuffy nose 06/22/20  Yes Rodriguez-Southworth, Nettie Elm, PA-C    Family History Family History  Problem Relation Age of Onset  . Colon cancer Mother   . Hyperlipidemia Mother   . Diabetes Mother        borderline  . Lung cancer Father     Social History Social History   Tobacco Use  . Smoking status: Never Smoker  . Smokeless tobacco: Never Used  Vaping Use  . Vaping Use: Never used  Substance Use Topics  .  Alcohol use: Yes    Comment: rare  . Drug use: No     Allergies   Methylprednisolone and Azithromycin   Review of Systems Review of Systems  Constitutional: Positive for diaphoresis. Negative for appetite change, chills, fatigue and fever.  HENT: Positive for postnasal drip and rhinorrhea. Negative for congestion, ear discharge and ear pain.   Eyes: Negative for discharge.  Respiratory: Positive for cough. Negative for chest tightness and shortness of breath.   Cardiovascular: Positive for chest pain.       R lower thorax region  Gastrointestinal: Negative for abdominal pain.  Musculoskeletal: Negative for myalgias.  Skin: Negative for rash.  Neurological: Negative for headaches.  Hematological: Negative for adenopathy.     Physical Exam Triage Vital Signs ED Triage Vitals  Enc Vitals Group     BP 06/22/20 1024 118/89     Pulse Rate 06/22/20 1024 88     Resp 06/22/20 1024 18     Temp 06/22/20 1024 98.6 F (37 C)     Temp Source 06/22/20 1024 Oral     SpO2 06/22/20 1024 99 %     Weight 06/22/20 1024 195 lb (88.5 kg)     Height 06/22/20 1024 5\' 7"  (1.702 m)     Head  Circumference --      Peak Flow --      Pain Score 06/22/20 1023 2     Pain Loc --      Pain Edu? --      Excl. in GC? --    No data found.  Updated Vital Signs BP 118/89 (BP Location: Left Arm)   Pulse 88   Temp 98.6 F (37 C) (Oral)   Resp 18   Ht 5\' 7"  (1.702 m)   Wt 195 lb (88.5 kg)   SpO2 99%   BMI 30.54 kg/m   Visual Acuity Right Eye Distance:   Left Eye Distance:   Bilateral Distance:    Right Eye Near:   Left Eye Near:    Bilateral Near:     Physical Exam Physical Exam Vitals signs and nursing note reviewed.  Constitutional:      General: he is not in acute distress.    Appearance: Normal appearance. he is not ill-appearing, toxic-appearing or diaphoretic.  HENT:     Head: Normocephalic.     Right Ear: Tympanic membrane, ear canal and external ear normal.     Left Ear:  Tympanic membrane, ear canal and external ear normal.     Nose: Nose normal.     Mouth/Throat:     Mouth: Mucous membranes are moist.  Eyes:     General: No scleral icterus.       Right eye: No discharge.        Left eye: No discharge.     Conjunctiva/sclera: Conjunctivae normal.  Neck:     Musculoskeletal: Neck supple. No neck rigidity.  Cardiovascular:     Rate and Rhythm: Normal rate and regular rhythm.     Heart sounds: No murmur.  Pulmonary:     Effort: Pulmonary effort is normal. Does not have chest wall tenderness on area of pain.     Breath sounds: Normal breath sounds.  Abdominal:     General: Bowel sounds are normal. There is no distension.     Palpations: Abdomen is soft. There is no mass.     Tenderness: There is no abdominal tenderness. There is no guarding or rebound.     Hernia: No hernia is present.  Musculoskeletal: Normal range of motion.  Lymphadenopathy:     Cervical: No cervical adenopathy.  Skin:    General: Skin is warm and dry.     Coloration: Skin is not jaundiced.     Findings: No rash.  Neurological:     Mental Status: he is alert and oriented to person, place, and time.     Gait: Gait normal.  Psychiatric:        Mood and Affect: Mood normal.        Behavior: Behavior normal.        Thought Content: Thought content normal.        Judgment: Judgment normal.   UC Treatments / Results  Labs (all labs ordered are listed, but only abnormal results are displayed) Labs Reviewed  SARS CORONAVIRUS 2 (TAT 6-24 HRS)    EKG   Radiology DG Chest 2 View  Result Date: 06/22/2020 CLINICAL DATA:  Back pain, shortness of breath, cough EXAM: CHEST - 2 VIEW COMPARISON:  04/26/2020 FINDINGS: The heart size and mediastinal contours are within normal limits. Both lungs are clear. The visualized skeletal structures are unremarkable. IMPRESSION: Negative. Electronically Signed   By: 04/28/2020 M.D.   On: 06/22/2020 12:28    Procedures Procedures (  including  critical care time)  Medications Ordered in UC Medications - No data to display  Initial Impression / Assessment and Plan / UC Course  I have reviewed the triage vital signs and the nursing notes. Pertinent  imaging results that were available during my care of the patient were reviewed by me and considered in my medical decision making (see chart for details). Covid test is pending Viral Glenford Peers with possibly Pleurisy. I placed him on Tessalon, and sudafed and advised him to take Ibuprofen up to 800 mg tid x 7 days.   Final Clinical Impressions(s) / UC Diagnoses   Final diagnoses:  Upper respiratory tract infection, unspecified type  Encounter for laboratory testing for COVID-19 virus  Pleurisy     Discharge Instructions     If your covid test is positive you need to quarantine for 5 days and then wear a mask for 5 more days even around family.  Here is your xray report: LINICAL DATA:  Back pain, shortness of breath, cough   EXAM: CHEST - 2 VIEW   COMPARISON:  04/26/2020   FINDINGS: The heart size and mediastinal contours are within normal limits. Both lungs are clear. The visualized skeletal structures are unremarkable.   IMPRESSION: Negative.     Electronically Signed   By: Charlett Nose M.D.   On: 06/22/2020 12:28  If your Covid test ends up positive you may take the following supplements to help your immune system be stronger to fight this viral infection Take Quarcetin 500 mg three times a day x 7 days with Zinc 50 mg ones a day x 7 days. The quarcetin is an antiviral and anti-inflammatory supplement which helps open the zinc channels in the cell to absorb Zinc. Zinc helps decrease the virus load in your body. Take Melatonin 6-10 mg at bed time which also helps support your immune system.  Also make sure to take Vit D 5,000 IU per day with a fatty meal and Vit C 5000 mg a day until you are completely better. To prevent viral illnesses your vitamin D should be between  60-80. Stay on Vitamin D 2,000  and C  1000 mg the rest of the season.  Don't lay around, keep active and walk as much as you are able to to prevent worsening of your symptoms.  Follow up with your family Dr next week.  If you get short of breath and you are able to check  your oxygen with a pulse oxygen meter, if it gets to 92% or less, you need to go to the hospital to be admitted. If you dont have one, come back here and we will assess you.      ED Prescriptions    Medication Sig Dispense Auth. Provider   benzonatate (TESSALON) 200 MG capsule Take 1 capsule (200 mg total) by mouth 3 (three) times daily as needed for cough. 30 capsule Rodriguez-Southworth, Nettie Elm, PA-C   pseudoephedrine (SUDAFED 12 HOUR) 120 MG 12 hr tablet Take 1 tablet (120 mg total) by mouth 2 (two) times daily. For stuffy nose 14 tablet Rodriguez-Southworth, Nettie Elm, PA-C     PDMP not reviewed this encounter.   Garey Ham, [place] 06/22/20 1856

## 2020-06-22 NOTE — ED Triage Notes (Signed)
Patient in today c/o nasal congestion, facial pain, sob and cough x 6 days. Patient denies fever. Patient has taken OTC Advil cold/sinus, Claritin and Afrin nasal spray. Patient has had the covid vaccine, no booster.

## 2020-09-13 ENCOUNTER — Other Ambulatory Visit: Payer: Self-pay | Admitting: Internal Medicine

## 2020-09-13 DIAGNOSIS — Z0000 Encounter for general adult medical examination without abnormal findings: Secondary | ICD-10-CM

## 2020-09-13 DIAGNOSIS — R1011 Right upper quadrant pain: Secondary | ICD-10-CM

## 2020-09-14 ENCOUNTER — Telehealth: Payer: Self-pay

## 2020-09-14 ENCOUNTER — Emergency Department (HOSPITAL_COMMUNITY)
Admission: EM | Admit: 2020-09-14 | Discharge: 2020-09-14 | Disposition: A | Discharge status: Home / Self Care | Payer: BC Managed Care – PPO | Attending: Emergency Medicine | Admitting: Emergency Medicine

## 2020-09-14 ENCOUNTER — Emergency Department (HOSPITAL_COMMUNITY): Payer: BC Managed Care – PPO

## 2020-09-14 ENCOUNTER — Other Ambulatory Visit: Payer: Self-pay

## 2020-09-14 ENCOUNTER — Encounter (HOSPITAL_COMMUNITY): Payer: Self-pay | Admitting: *Deleted

## 2020-09-14 DIAGNOSIS — N200 Calculus of kidney: Secondary | ICD-10-CM | POA: Insufficient documentation

## 2020-09-14 DIAGNOSIS — R109 Unspecified abdominal pain: Secondary | ICD-10-CM | POA: Diagnosis present

## 2020-09-14 LAB — BASIC METABOLIC PANEL
Anion gap: 9 (ref 5–15)
BUN: 14 mg/dL (ref 6–20)
CO2: 26 mmol/L (ref 22–32)
Calcium: 9.4 mg/dL (ref 8.9–10.3)
Chloride: 104 mmol/L (ref 98–111)
Creatinine, Ser: 0.91 mg/dL (ref 0.61–1.24)
GFR, Estimated: >60 mL/min (ref >60)
Glucose, Bld: 123 mg/dL — ABNORMAL HIGH (ref 70–99)
Potassium: 3.5 mmol/L (ref 3.5–5.1)
Sodium: 139 mmol/L (ref 135–145)

## 2020-09-14 LAB — URINALYSIS, ROUTINE W REFLEX MICROSCOPIC
Bacteria, UA: NONE SEEN
Bilirubin Urine: NEGATIVE
Glucose, UA: NEGATIVE mg/dL
Ketones, ur: NEGATIVE mg/dL
Leukocytes,Ua: NEGATIVE
Nitrite: NEGATIVE
Protein, ur: NEGATIVE mg/dL
Specific Gravity, Urine: 1.012 (ref 1.005–1.030)
pH: 6 (ref 5.0–8.0)

## 2020-09-14 LAB — CBC
HCT: 49 % (ref 39.0–52.0)
Hemoglobin: 17 g/dL (ref 13.0–17.0)
MCH: 31 pg (ref 26.0–34.0)
MCHC: 34.7 g/dL (ref 30.0–36.0)
MCV: 89.4 fL (ref 80.0–100.0)
Platelets: 185 10*3/uL (ref 150–400)
RBC: 5.48 MIL/uL (ref 4.22–5.81)
RDW: 12.1 % (ref 11.5–15.5)
WBC: 6.2 10*3/uL (ref 4.0–10.5)
nRBC: 0 % (ref 0.0–0.2)

## 2020-09-14 MED ORDER — OXYCODONE HCL 5 MG PO TABS: 5.0000 mg | ORAL_TABLET | ORAL | 0 refills | Status: DC | PRN

## 2020-09-14 MED ORDER — NAPROXEN 500 MG PO TABS: 500.0000 mg | ORAL_TABLET | Freq: Two times a day (BID) | ORAL | 0 refills | Status: AC

## 2020-09-14 MED ORDER — TAMSULOSIN HCL 0.4 MG PO CAPS: 0.4000 mg | ORAL_CAPSULE | Freq: Every day | ORAL | 0 refills | Status: DC

## 2020-09-14 MED ORDER — KETOROLAC TROMETHAMINE 15 MG/ML IJ SOLN
15.0000 mg | Freq: Once | INTRAMUSCULAR | Status: AC
  Administered 2020-09-14: 15 mg via INTRAVENOUS
  Filled 2020-09-14: qty 1

## 2020-09-14 MED ORDER — SODIUM CHLORIDE 0.9 % IV BOLUS
1000.0000 mL | Freq: Once | INTRAVENOUS | Status: AC
  Administered 2020-09-14: 1000 mL via INTRAVENOUS

## 2020-09-14 MED ORDER — SODIUM CHLORIDE 0.9 % IV BOLUS
500.0000 mL | Freq: Once | INTRAVENOUS | Status: AC
  Administered 2020-09-14: 500 mL via INTRAVENOUS

## 2020-09-14 NOTE — ED Provider Notes (Signed)
[facility] Emergency Department Provider Note MRN:  [id number]  Arrival date & time: 09/14/20     Chief Complaint   Flank Pain   History of Present Illness   Michael Jenkins is a 35 y.o. year-old male with no pertinent past medical presenting to the ED with chief complaint of flank pain.  Location: Left flank with radiation into the left abdomen Duration: 2 hours Onset: Sudden Timing: Constant Description: Sharp Severity: Initially 10 out of 10 but has improved to 5 out of 10 Exacerbating/Alleviating Factors: None Associated Symptoms: Nausea, diaphoresis Pertinent Negatives: Denies vomiting, no chest pain or shortness of breath, no recent fever, no dysuria, no hematuria.   Review of Systems  A complete 10 system review of systems was obtained and all systems are negative except as noted in the HPI and PMH.   Patient's Health History    Past Medical History:  Diagnosis Date  . Arrhythmia   . Chronic back pain   . Irregular heartbeat     Past Surgical History:  Procedure Laterality Date  . COLONOSCOPY WITH PROPOFOL N/A 08/22/2016   Procedure: COLONOSCOPY WITH PROPOFOL;  Surgeon: Earline Mayotte, MD;  Location[facility] END[facility]COPY;  Service: Endoscopy;  Laterality: N/A;  . HERNIA REPAIR  1986, 2005   umbilical   . KNEE SURGERY      Family History  Problem Relation Age of Onset  . Colon cancer Mother   . Hyperlipidemia Mother   . Diabetes Mother        borderline  . Lung cancer Father     Social History   Socioeconomic History  . Marital status: Married    Spouse name: Not on file  . Number of children: Not on file  . Years of education: Not on file  . Highest education level: Not on file  Occupational History  . Not on file  Tobacco Use  . Smoking status: Never Smoker  . Smokeless tobacco: Never Used  Vaping Use  . Vaping Use: Never used  Substance and Sexual Activity  . Alcohol use: Yes    Comment: rare  . Drug use: No  . Sexual  activity: Not on file  Other Topics Concern  . Not on file  Social History Narrative  . Not on file   Social Determinants of Health   Financial Resource Strain: Not on file  Food Insecurity: Not on file  Transportation Needs: Not on file  Physical Activity: Not on file  Stress: Not on file  Social Connections: Not on file  Intimate Partner Violence: Not on file     Physical Exam   Vitals:   09/14/20 0600 09/14/20 0648  BP: 120/82 116/88  Pulse: 73 70  Resp:  18  Temp:    SpO2: 98% 100%    CONSTITUTIONAL: Well-appearing, NAD NEURO:  Alert and oriented x 3, no focal deficits EYES:  eyes equal and reactive ENT/NECK:  no LAD, no JVD CARDIO: Regular rate, well-perfused, normal S1 and S2 PULM:  CTAB no wheezing or rhonchi GI/GU:  normal bowel sounds, non-distended, mild tenderness to left flank MSK/SPINE:  No gross deformities, no edema SKIN:  no rash, atraumatic PSYCH:  Appropriate speech and behavior  *Additional and/or pertinent findings included in MDM below  Diagnostic and Interventional Summary    EKG Interpretation  Date/Time:    Ventricular Rate:    PR Interval:    QRS Duration:   QT Interval:    QTC Calculation:   R Axis:  Text Interpretation:        Labs Reviewed  BASIC METABOLIC PANEL - Abnormal; Notable for the following components:      Result Value   Glucose, Bld 123 (*)    All other components within normal limits  URINALYSIS, ROUTINE W REFLEX MICROSCOPIC - Abnormal; Notable for the following components:   Hgb urine dipstick LARGE (*)    All other components within normal limits  CBC    CT RENAL STONE STUDY  Final Result      Medications  ketorolac (TORADOL) 15 MG/ML injection 15 mg (15 mg Intravenous Given 09/14/20 0456)  sodium chloride 0.9 % bolus 500 mL (0 mLs Intravenous Stopped 09/14/20 0554)  sodium chloride 0.9 % bolus 1,000 mL (1,000 mLs Intravenous New Bag/Given 09/14/20 0554)     Procedures  /  Critical  Care Procedures  ED Course and Medical Decision Making  I have reviewed the triage vital signs, the nursing notes, and pertinent available records from the EMR.  Listed above are laboratory and imaging tests that I personally ordered, reviewed, and interpreted and then considered in my medical decision making (see below for details).  CT to evaluate for kidney stone versus diverticulitis     CT confirms large kidney stone.  Patient is without AKI, no evidence of UTI.  Pain is better controlled.  Discussed case with Dr. Ronne Binning, who will see in the office later this week.  Appropriate for discharge.  Michael Sow. Pilar Plate, MD [facility] Emergency Medicine [facility] [email or web address]  Final Clinical Impressions(s) / ED Diagnoses     ICD-10-CM   1. Kidney stone  N20.0     ED Discharge Orders         Ordered    tamsulosin (FLOMAX) 0.4 MG CAPS capsule  Daily        09/14/20 0721    naproxen (NAPROSYN) 500 MG tablet  2 times daily        09/14/20 0721    oxyCODONE (ROXICODONE) 5 MG immediate release tablet  Every 4 hours PRN        09/14/20 0721           Discharge Instructions Discussed with and Provided to Patient:     Discharge Instructions     You were evaluated in the Emergency Department and after careful evaluation, we did not find any emergent condition requiring admission or further testing in the hospital.  Your exam/testing today was overall reassuring.  Symptoms seem to due to a kidney stone.  Please call the [facility] office to set up a follow-up appointment with Dr. Ronne Binning.  He should be able to see you on Friday.  Use the Naprosyn medication as prescribed for pain.  Use the Flomax medication daily to help try to pass the stone.  Can use the oxycodone tablet for more significant pain.  Please return to the Emergency Department if you experience any worsening of your condition.  Thank you for allowing [place] to be a part of your  care.        Sabas Sous, MD 09/14/20 [phone]

## 2020-09-14 NOTE — Discharge Instructions (Addendum)
You were evaluated in the Emergency Department and after careful evaluation, we did not find any emergent condition requiring admission or further testing in the hospital.  Your exam/testing today was overall reassuring.  Symptoms seem to due to a kidney stone.  Please call the [facility] office to set up a follow-up appointment with Dr. Ronne Binning.  He should be able to see you on Friday.  Use the Naprosyn medication as prescribed for pain.  Use the Flomax medication daily to help try to pass the stone.  Can use the oxycodone tablet for more significant pain.  Please return to the Emergency Department if you experience any worsening of your condition.  Thank you for allowing [place] to be a part of your care.

## 2020-09-14 NOTE — ED Triage Notes (Signed)
Pt awoken from sleep around 0340 tonight with 10/10 flank pain radiating front to back on left side.

## 2020-09-14 NOTE — Telephone Encounter (Signed)
Opened in error

## 2020-09-16 ENCOUNTER — Other Ambulatory Visit: Payer: Self-pay

## 2020-09-16 ENCOUNTER — Encounter: Payer: Self-pay | Admitting: Urology

## 2020-09-16 ENCOUNTER — Ambulatory Visit (INDEPENDENT_AMBULATORY_CARE_PROVIDER_SITE_OTHER): Payer: BC Managed Care – PPO | Admitting: Urology

## 2020-09-16 ENCOUNTER — Ambulatory Visit (HOSPITAL_COMMUNITY)
Admission: RE | Admit: 2020-09-16 | Discharge: 2020-09-16 | Disposition: A | Discharge status: Home / Self Care | Payer: BC Managed Care – PPO | Source: Ambulatory Visit | Attending: Urology | Admitting: Urology

## 2020-09-16 VITALS — BP 130/85 | HR 67 | Temp 98.0°F | Ht 67.0 in

## 2020-09-16 DIAGNOSIS — N200 Calculus of kidney: Secondary | ICD-10-CM

## 2020-09-16 LAB — URINALYSIS, ROUTINE W REFLEX MICROSCOPIC
Bilirubin, UA: NEGATIVE
Glucose, UA: NEGATIVE
Ketones, UA: NEGATIVE
Nitrite, UA: NEGATIVE
Protein,UA: NEGATIVE
Specific Gravity, UA: 1.025 (ref 1.005–1.030)
Urobilinogen, Ur: 0.2 mg/dL (ref 0.2–1.0)
pH, UA: 6 (ref 5.0–7.5)

## 2020-09-16 LAB — MICROSCOPIC EXAMINATION
Epithelial Cells (non renal): NONE SEEN /hpf (ref 0–10)
Renal Epithel, UA: NONE SEEN /hpf

## 2020-09-16 MED ORDER — OXYCODONE HCL 5 MG PO TABS: 5.0000 mg | ORAL_TABLET | ORAL | 0 refills | Status: DC | PRN

## 2020-09-16 MED ORDER — TAMSULOSIN HCL 0.4 MG PO CAPS: 0.4000 mg | ORAL_CAPSULE | Freq: Every day | ORAL | 0 refills | Status: DC

## 2020-09-16 NOTE — Progress Notes (Signed)
09/16/2020 11:33 AM   Bennett Scrape 1985-06-10 [id number]  Referring provider: [facility] [address] [place],  [place] [address]  nephrolithiasis  HPI: Mr Kahre is a 34yo here for evaluation of a left ureteral calculus. This is his 2nd stone event. His first stone event when he was 13. The pain started Monday on the left side and is sharp, intermittent, moderate to severe and nonraditing. Pain is currently dull and aching. He went to the ER Wednesday and CT shows a 1cm left UPJ calculus. No significant LUTS.   PMH: Past Medical History:  Diagnosis Date  . Arrhythmia   . Chronic back pain   . Irregular heartbeat     Surgical History: Past Surgical History:  Procedure Laterality Date  . COLONOSCOPY WITH PROPOFOL N/A 08/22/2016   Procedure: COLONOSCOPY WITH PROPOFOL;  Surgeon: Earline Mayotte, MD;  Location: [facility] ENDOSCOPY;  Service: Endoscopy;  Laterality: N/A;  . HERNIA REPAIR  1986, 2005   umbilical   . KNEE SURGERY      Home Medications:  Allergies as of 09/16/2020      Reactions   Methylprednisolone Hives   ALLERGY TO STEROID DOSE PACK Other reaction(s): Other (See Comments), Other (See Comments), Other (See Comments) ALLERGY TO STEROID DOSE PACK While taking dose pack any time water (rain, shower water) would contact skin, it felt like needles. ALLERGY TO STEROID DOSE PACK While taking dose pack any time water (rain, shower water) would contact skin, it felt like needles. ALLERGY TO STEROID DOSE PACK ALLERGY TO STEROID DOSE PACK While taking dose pack any time water (rain, shower water) would contact skin, it felt like needles. ALLERGY TO STEROID DOSE PACK ALLERGY TO STEROID DOSE PACK While taking dose pack any time water (rain, shower water) would contact skin, it felt like needles. While taking dose pack any time water (rain, shower water) would contact skin, it felt like needles. ALLERGY TO STEROID DOSE PACK   Azithromycin    Other  reaction(s): Other (See Comments), Other (See Comments) Sensitive skin Sensitive skin      Medication List       Accurate as of Sep 16, 2020 11:33 AM. If you have any questions, ask your nurse or doctor.        STOP taking these medications   benzonatate 200 MG capsule Commonly known as: TESSALON Stopped by: Wilkie Aye, MD   pseudoephedrine 120 MG 12 hr tablet Commonly known as: Sudafed 12 Hour Stopped by: Wilkie Aye, MD     TAKE these medications   acetaminophen 650 MG CR tablet Commonly known as: TYLENOL Take 650 mg by mouth every 8 (eight) hours as needed for pain.   cetirizine 10 MG tablet Commonly known as: ZYRTEC Take 10 mg by mouth daily.   ibuprofen 800 MG tablet Commonly known as: ADVIL Take 800 mg by mouth every 8 (eight) hours as needed.   naproxen 500 MG tablet Commonly known as: NAPROSYN Take 1 tablet (500 mg total) by mouth 2 (two) times daily.   oxyCODONE 5 MG immediate release tablet Commonly known as: Roxicodone Take 1 tablet (5 mg total) by mouth every 4 (four) hours as needed for severe pain.   tamsulosin 0.4 MG Caps capsule Commonly known as: Flomax Take 1 capsule (0.4 mg total) by mouth daily.       Allergies:  Allergies  Allergen Reactions  . Methylprednisolone Hives    ALLERGY TO STEROID DOSE PACK Other reaction(s): Other (See Comments), Other (  See Comments), Other (See Comments) ALLERGY TO STEROID DOSE PACK While taking dose pack any time water (rain, shower water) would contact skin, it felt like needles. ALLERGY TO STEROID DOSE PACK While taking dose pack any time water (rain, shower water) would contact skin, it felt like needles. ALLERGY TO STEROID DOSE PACK ALLERGY TO STEROID DOSE PACK While taking dose pack any time water (rain, shower water) would contact skin, it felt like needles. ALLERGY TO STEROID DOSE PACK ALLERGY TO STEROID DOSE PACK While taking dose pack any time water (rain, shower water) would  contact skin, it felt like needles. While taking dose pack any time water (rain, shower water) would contact skin, it felt like needles. ALLERGY TO STEROID DOSE PACK   . Azithromycin     Other reaction(s): Other (See Comments), Other (See Comments) Sensitive skin Sensitive skin     Family History: Family History  Problem Relation Age of Onset  . Colon cancer Mother   . Hyperlipidemia Mother   . Diabetes Mother        borderline  . Lung cancer Father     Social History:  reports that he has never smoked. He has never used smokeless tobacco. He reports current alcohol use. He reports that he does not use drugs.  ROS: All other review of systems were reviewed and are negative except what is noted above in HPI  Physical Exam: BP 130/85   Pulse 67   Temp 98 F (36.7 C) (Oral)   Ht 5\' 7"  (1.702 m)   BMI 31.32 kg/m   Constitutional:  Alert and oriented, No acute distress. HEENT: [place] AT, moist mucus membranes.  Trachea midline, no masses. Cardiovascular: No clubbing, cyanosis, or edema. Respiratory: Normal respiratory effort, no increased work of breathing. GI: Abdomen is soft, nontender, nondistended, no abdominal masses GU: No CVA tenderness.  Lymph: No cervical or inguinal lymphadenopathy. Skin: No rashes, bruises or suspicious lesions. Neurologic: Grossly intact, no focal deficits, moving all 4 extremities. Psychiatric: Normal mood and affect.  Laboratory Data: Lab Results  Component Value Date   WBC 6.2 09/14/2020   HGB 17.0 09/14/2020   HCT 49.0 09/14/2020   MCV 89.4 09/14/2020   PLT 185 09/14/2020    Lab Results  Component Value Date   CREATININE 0.91 09/14/2020    No results found for: PSA  No results found for: TESTOSTERONE  No results found for: HGBA1C  Urinalysis    Component Value Date/Time   COLORURINE YELLOW 09/14/2020 0445   APPEARANCEUR Clear 09/16/2020 1112   LABSPEC 1.012 09/14/2020 0445   PHURINE 6.0 09/14/2020 0445   GLUCOSEU  Negative 09/16/2020 1112   HGBUR LARGE (A) 09/14/2020 0445   BILIRUBINUR Negative 09/16/2020 1112   KETONESUR NEGATIVE 09/14/2020 0445   PROTEINUR Negative 09/16/2020 1112   PROTEINUR NEGATIVE 09/14/2020 0445   NITRITE Negative 09/16/2020 1112   NITRITE NEGATIVE 09/14/2020 0445   LEUKOCYTESUR Trace (A) 09/16/2020 1112   LEUKOCYTESUR NEGATIVE 09/14/2020 0445    Lab Results  Component Value Date   LABMICR See below: 09/16/2020   WBCUA 0-5 09/16/2020   LABEPIT None seen 09/16/2020   MUCUS Present 09/16/2020   BACTERIA Few 09/16/2020    Pertinent Imaging: CT stone study 09/14/2020: Images reviewed and discussed with the patient.  No results found for this or any previous visit.  No results found for this or any previous visit.  No results found for this or any previous visit.  No results found for this or any previous  visit.  No results found for this or any previous visit.  No results found for this or any previous visit.  No results found for this or any previous visit.  Results for orders placed during the hospital encounter of 09/14/20  CT RENAL STONE STUDY  Narrative CLINICAL DATA:  Flank pain on the left with kidney stone suspected  EXAM: CT ABDOMEN AND PELVIS WITHOUT CONTRAST  TECHNIQUE: Multidetector CT imaging of the abdomen and pelvis was performed following the standard protocol without IV contrast.  COMPARISON:  None.  FINDINGS: Lower chest:  Negative  Hepatobiliary: Hepatic steatosis.No evidence of biliary obstruction or stone.  Pancreas: Unremarkable.  Spleen: Unremarkable.  Adrenals/Urinary Tract: Negative adrenals. Mild left hydronephrosis, periureteric stranding, and low-density renal expansion from a UPJ stone which measures 10 x 5 mm. 3 mm stone at the lower pole right kidney. Unremarkable bladder.  Stomach/Bowel:  No obstruction. No appendicitis.  Vascular/Lymphatic: No acute vascular abnormality. No mass  or adenopathy.  Reproductive:No pathologic findings.  Other: No ascites or pneumoperitoneum. Shallow fatty right inguinal hernia.  Musculoskeletal: No acute abnormalities.  IMPRESSION: 1. Left urinary obstruction from a 10 x 5 mm UPJ calculus. 2. Small right renal calculus. 3. Hepatic steatosis.   Electronically Signed By: Marnee Spring M.D. On: 09/14/2020 05:24   Assessment & Plan:    1. Kidney stones -We discussed the management of kidney stones. These options include observation, ureteroscopy, shockwave lithotripsy (ESWL) and percutaneous nephrolithotomy (PCNL). We discussed which options are relevant to the patient's stone(s). We discussed the natural history of kidney stones as well as the complications of untreated stones and the impact on quality of life without treatment as well as with each of the above listed treatments. We also discussed the efficacy of each treatment in its ability to clear the stone burden. With any of these management options I discussed the signs and symptoms of infection and the need for emergent treatment should these be experienced. For each option we discussed the ability of each procedure to clear the patient of their stone burden.   For observation I described the risks which include but are not limited to silent renal damage, life-threatening infection, need for emergent surgery, failure to pass stone and pain.   For ureteroscopy I described the risks which include bleeding, infection, damage to contiguous structures, positioning injury, ureteral stricture, ureteral avulsion, ureteral injury, need for prolonged ureteral stent, inability to perform ureteroscopy, need for an interval procedure, inability to clear stone burden, stent discomfort/pain, heart attack, stroke, pulmonary embolus and the inherent risks with general anesthesia.   For shockwave lithotripsy I described the risks which include arrhythmia, kidney contusion, kidney hemorrhage,  need for transfusion, pain, inability to adequately break up stone, inability to pass stone fragments, Steinstrasse, infection associated with obstructing stones, need for alternate surgical procedure, need for repeat shockwave lithotripsy, MI, CVA, PE and the inherent risks with anesthesia/conscious sedation.   For PCNL I described the risks including positioning injury, pneumothorax, hydrothorax, need for chest tube, inability to clear stone burden, renal laceration, arterial venous fistula or malformation, need for embolization of kidney, loss of kidney or renal function, need for repeat procedure, need for prolonged nephrostomy tube, ureteral avulsion, MI, CVA, PE and the inherent risks of general anesthesia.   - The patient would like to proceed with left ESWL.   - Urinalysis, Routine w reflex microscopic   No follow-ups on file.  Wilkie Aye, MD  [facility] [place]

## 2020-09-16 NOTE — Patient Instructions (Signed)
[employer] (25th ed., pp. 811-816). [place], [place]: [employer], [employer]. Retrieved from https://www.clinicalkey.com/#!/content/book/[email or web address]?[email or web address]">  Lithotripsy  Lithotripsy is a treatment that can help break up kidney stones that are too large to pass on their own. This is a nonsurgical procedure that crushes a kidney stone with shock waves. These shock waves pass through your body and focus on the kidney stone. They cause the kidney stone to break up into smaller pieces while it is still in the urinary tract. The smaller pieces of stone can pass more easily out of your body in the urine. Tell a health care provider about:  Any allergies you have.  All medicines you are taking, including vitamins, herbs, eye drops, creams, and over-the-counter medicines.  Any problems you or family members have had with anesthetic medicines.  Any blood disorders you have.  Any surgeries you have had.  Any medical conditions you have.  Whether you are pregnant or may be pregnant. What are the risks? Generally, this is a safe procedure. However, problems may occur, including:  Infection.  Bleeding from the kidney.  Bruising of the kidney or skin.  Scarring of the kidney, which can lead to: ? Increased blood pressure. ? Poor kidney function. ? Return (recurrence) of kidney stones.  Damage to other structures or organs, such as the liver, colon, spleen, or pancreas.  Blockage (obstruction) of the tube that carries urine from the kidney to the bladder (ureter).  Failure of the kidney stone to break into pieces (fragments). What happens before the procedure? Staying hydrated Follow instructions from your health care provider about hydration, which may include:  Up to 2 hours before the procedure - you may continue to drink clear liquids, such as water, clear fruit juice, black coffee, and plain tea. Eating and drinking restrictions Follow  instructions from your health care provider about eating and drinking, which may include:  8 hours before the procedure - stop eating heavy meals or foods, such as meat, fried foods, or fatty foods.  6 hours before the procedure - stop eating light meals or foods, such as toast or cereal.  6 hours before the procedure - stop drinking milk or drinks that contain milk.  2 hours before the procedure - stop drinking clear liquids. Medicines Ask your health care provider about:  Changing or stopping your regular medicines. This is especially important if you are taking diabetes medicines or blood thinners.  Taking medicines such as aspirin and ibuprofen. These medicines can thin your blood. Do not take these medicines unless your health care provider tells you to take them.  Taking over-the-counter medicines, vitamins, herbs, and supplements. Tests You may have tests, such as:  Blood tests.  Urine tests.  Imaging tests, such as a CT scan. General instructions  Plan to have someone take you home from the hospital or clinic.  If you will be going home right after the procedure, plan to have someone with you for 24 hours.  Ask your health care provider what steps will be taken to help prevent infection. These may include washing skin with a germ-killing soap. What happens during the procedure?  An IV will be inserted into one of your veins.  You will be given one or more of the following: ? A medicine to help you relax (sedative). ? A medicine to make you fall asleep (general anesthetic).  A water-filled cushion may be placed behind your kidney or on your abdomen. In some cases, you may be placed in a tub of   lukewarm water.  Your body will be positioned in a way that makes it easy to target the kidney stone.  An X-ray or ultrasound exam will be done to locate your stone.  Shock waves will be aimed at the stone. If you are awake, you may feel a tapping sensation as the shock  waves pass through your body.  A flexible tube with holes in it (stent) may be placed in the ureter. This will help keep urine flowing from the kidney if the fragments of the stone have been blocking the ureter. The procedure may vary among health care providers and hospitals.   What happens after the procedure?  You may have an X-ray to see whether the procedure was able to break up the kidney stone and how much of the stone has passed. If large stone fragments remain after treatment, you may need to have a second procedure at a later time.  Your blood pressure, heart rate, breathing rate, and blood oxygen level will be monitored until you leave the hospital or clinic.  You may be given antibiotics or pain medicine as needed.  If a stent was placed in your ureter during surgery, it may stay in place for a few weeks.  You may need to strain your urine to collect pieces of the kidney stone for testing.  You will need to drink plenty of water.  If you were given a sedative during the procedure, it can affect you for several hours. Do not drive or operate machinery until your health care provider says that it is safe. Summary  Lithotripsy is a treatment that can help break up kidney stones that are too large to pass on their own.  Lithotripsy is a nonsurgical procedure that crushes a kidney stone with shock waves.  Generally, this is a safe procedure. However, problems may occur, including damage to the kidney or other organs, infection, or obstruction of the tube that carries urine from the kidney to the bladder (ureter).  You may have a stent placed in your ureter to help drain your urine. This stent may stay in place for a few weeks.  After the procedure, you will need to drink plenty of water. You may be asked to strain your urine to collect pieces of the kidney stone for testing. This information is not intended to replace advice given to you by your health care provider. Make sure  you discuss any questions you have with your health care provider. Document Revised: 02/04/2019 Document Reviewed: 02/04/2019 Elsevier Patient Education  2021 [employer].  

## 2020-09-16 NOTE — H&P (View-Only) (Signed)
09/16/2020 11:33 AM   Bennett Scrape 1985-06-10 [id number]  Referring provider: [facility] [address] [place],  [place] [address]  nephrolithiasis  HPI: Mr Michael Jenkins is a 35yo here for evaluation of a left ureteral calculus. This is his 2nd stone event. His first stone event when he was 35. The pain started Monday on the left side and is sharp, intermittent, moderate to severe and nonraditing. Pain is currently dull and aching. He went to the ER Wednesday and CT shows a 1cm left UPJ calculus. No significant LUTS.   PMH: Past Medical History:  Diagnosis Date  . Arrhythmia   . Chronic back pain   . Irregular heartbeat     Surgical History: Past Surgical History:  Procedure Laterality Date  . COLONOSCOPY WITH PROPOFOL N/A 08/22/2016   Procedure: COLONOSCOPY WITH PROPOFOL;  Surgeon: Earline Mayotte, MD;  Location: [facility] ENDOSCOPY;  Service: Endoscopy;  Laterality: N/A;  . HERNIA REPAIR  1986, 2005   umbilical   . KNEE SURGERY      Home Medications:  Allergies as of 09/16/2020      Reactions   Methylprednisolone Hives   ALLERGY TO STEROID DOSE PACK Other reaction(s): Other (See Comments), Other (See Comments), Other (See Comments) ALLERGY TO STEROID DOSE PACK While taking dose pack any time water (rain, shower water) would contact skin, it felt like needles. ALLERGY TO STEROID DOSE PACK While taking dose pack any time water (rain, shower water) would contact skin, it felt like needles. ALLERGY TO STEROID DOSE PACK ALLERGY TO STEROID DOSE PACK While taking dose pack any time water (rain, shower water) would contact skin, it felt like needles. ALLERGY TO STEROID DOSE PACK ALLERGY TO STEROID DOSE PACK While taking dose pack any time water (rain, shower water) would contact skin, it felt like needles. While taking dose pack any time water (rain, shower water) would contact skin, it felt like needles. ALLERGY TO STEROID DOSE PACK   Azithromycin    Other  reaction(s): Other (See Comments), Other (See Comments) Sensitive skin Sensitive skin      Medication List       Accurate as of Sep 16, 2020 11:33 AM. If you have any questions, ask your nurse or doctor.        STOP taking these medications   benzonatate 200 MG capsule Commonly known as: TESSALON Stopped by: Wilkie Aye, MD   pseudoephedrine 120 MG 12 hr tablet Commonly known as: Sudafed 12 Hour Stopped by: Wilkie Aye, MD     TAKE these medications   acetaminophen 650 MG CR tablet Commonly known as: TYLENOL Take 650 mg by mouth every 8 (eight) hours as needed for pain.   cetirizine 10 MG tablet Commonly known as: ZYRTEC Take 10 mg by mouth daily.   ibuprofen 800 MG tablet Commonly known as: ADVIL Take 800 mg by mouth every 8 (eight) hours as needed.   naproxen 500 MG tablet Commonly known as: NAPROSYN Take 1 tablet (500 mg total) by mouth 2 (two) times daily.   oxyCODONE 5 MG immediate release tablet Commonly known as: Roxicodone Take 1 tablet (5 mg total) by mouth every 4 (four) hours as needed for severe pain.   tamsulosin 0.4 MG Caps capsule Commonly known as: Flomax Take 1 capsule (0.4 mg total) by mouth daily.       Allergies:  Allergies  Allergen Reactions  . Methylprednisolone Hives    ALLERGY TO STEROID DOSE PACK Other reaction(s): Other (See Comments), Other (  See Comments), Other (See Comments) ALLERGY TO STEROID DOSE PACK While taking dose pack any time water (rain, shower water) would contact skin, it felt like needles. ALLERGY TO STEROID DOSE PACK While taking dose pack any time water (rain, shower water) would contact skin, it felt like needles. ALLERGY TO STEROID DOSE PACK ALLERGY TO STEROID DOSE PACK While taking dose pack any time water (rain, shower water) would contact skin, it felt like needles. ALLERGY TO STEROID DOSE PACK ALLERGY TO STEROID DOSE PACK While taking dose pack any time water (rain, shower water) would  contact skin, it felt like needles. While taking dose pack any time water (rain, shower water) would contact skin, it felt like needles. ALLERGY TO STEROID DOSE PACK   . Azithromycin     Other reaction(s): Other (See Comments), Other (See Comments) Sensitive skin Sensitive skin     Family History: Family History  Problem Relation Age of Onset  . Colon cancer Mother   . Hyperlipidemia Mother   . Diabetes Mother        borderline  . Lung cancer Father     Social History:  reports that he has never smoked. He has never used smokeless tobacco. He reports current alcohol use. He reports that he does not use drugs.  ROS: All other review of systems were reviewed and are negative except what is noted above in HPI  Physical Exam: BP 130/85   Pulse 67   Temp 98 F (36.7 C) (Oral)   Ht 5\' 7"  (1.702 m)   BMI 31.32 kg/m   Constitutional:  Alert and oriented, No acute distress. HEENT: [place] AT, moist mucus membranes.  Trachea midline, no masses. Cardiovascular: No clubbing, cyanosis, or edema. Respiratory: Normal respiratory effort, no increased work of breathing. GI: Abdomen is soft, nontender, nondistended, no abdominal masses GU: No CVA tenderness.  Lymph: No cervical or inguinal lymphadenopathy. Skin: No rashes, bruises or suspicious lesions. Neurologic: Grossly intact, no focal deficits, moving all 4 extremities. Psychiatric: Normal mood and affect.  Laboratory Data: Lab Results  Component Value Date   WBC 6.2 09/14/2020   HGB 17.0 09/14/2020   HCT 49.0 09/14/2020   MCV 89.4 09/14/2020   PLT 185 09/14/2020    Lab Results  Component Value Date   CREATININE 0.91 09/14/2020    No results found for: PSA  No results found for: TESTOSTERONE  No results found for: HGBA1C  Urinalysis    Component Value Date/Time   COLORURINE YELLOW 09/14/2020 0445   APPEARANCEUR Clear 09/16/2020 1112   LABSPEC 1.012 09/14/2020 0445   PHURINE 6.0 09/14/2020 0445   GLUCOSEU  Negative 09/16/2020 1112   HGBUR LARGE (A) 09/14/2020 0445   BILIRUBINUR Negative 09/16/2020 1112   KETONESUR NEGATIVE 09/14/2020 0445   PROTEINUR Negative 09/16/2020 1112   PROTEINUR NEGATIVE 09/14/2020 0445   NITRITE Negative 09/16/2020 1112   NITRITE NEGATIVE 09/14/2020 0445   LEUKOCYTESUR Trace (A) 09/16/2020 1112   LEUKOCYTESUR NEGATIVE 09/14/2020 0445    Lab Results  Component Value Date   LABMICR See below: 09/16/2020   WBCUA 0-5 09/16/2020   LABEPIT None seen 09/16/2020   MUCUS Present 09/16/2020   BACTERIA Few 09/16/2020    Pertinent Imaging: CT stone study 09/14/2020: Images reviewed and discussed with the patient.  No results found for this or any previous visit.  No results found for this or any previous visit.  No results found for this or any previous visit.  No results found for this or any previous  visit.  No results found for this or any previous visit.  No results found for this or any previous visit.  No results found for this or any previous visit.  Results for orders placed during the hospital encounter of 09/14/20  CT RENAL STONE STUDY  Narrative CLINICAL DATA:  Flank pain on the left with kidney stone suspected  EXAM: CT ABDOMEN AND PELVIS WITHOUT CONTRAST  TECHNIQUE: Multidetector CT imaging of the abdomen and pelvis was performed following the standard protocol without IV contrast.  COMPARISON:  None.  FINDINGS: Lower chest:  Negative  Hepatobiliary: Hepatic steatosis.No evidence of biliary obstruction or stone.  Pancreas: Unremarkable.  Spleen: Unremarkable.  Adrenals/Urinary Tract: Negative adrenals. Mild left hydronephrosis, periureteric stranding, and low-density renal expansion from a UPJ stone which measures 10 x 5 mm. 3 mm stone at the lower pole right kidney. Unremarkable bladder.  Stomach/Bowel:  No obstruction. No appendicitis.  Vascular/Lymphatic: No acute vascular abnormality. No mass  or adenopathy.  Reproductive:No pathologic findings.  Other: No ascites or pneumoperitoneum. Shallow fatty right inguinal hernia.  Musculoskeletal: No acute abnormalities.  IMPRESSION: 1. Left urinary obstruction from a 10 x 5 mm UPJ calculus. 2. Small right renal calculus. 3. Hepatic steatosis.   Electronically Signed By: Marnee Spring M.D. On: 09/14/2020 05:24   Assessment & Plan:    1. Kidney stones -We discussed the management of kidney stones. These options include observation, ureteroscopy, shockwave lithotripsy (ESWL) and percutaneous nephrolithotomy (PCNL). We discussed which options are relevant to the patient's stone(s). We discussed the natural history of kidney stones as well as the complications of untreated stones and the impact on quality of life without treatment as well as with each of the above listed treatments. We also discussed the efficacy of each treatment in its ability to clear the stone burden. With any of these management options I discussed the signs and symptoms of infection and the need for emergent treatment should these be experienced. For each option we discussed the ability of each procedure to clear the patient of their stone burden.   For observation I described the risks which include but are not limited to silent renal damage, life-threatening infection, need for emergent surgery, failure to pass stone and pain.   For ureteroscopy I described the risks which include bleeding, infection, damage to contiguous structures, positioning injury, ureteral stricture, ureteral avulsion, ureteral injury, need for prolonged ureteral stent, inability to perform ureteroscopy, need for an interval procedure, inability to clear stone burden, stent discomfort/pain, heart attack, stroke, pulmonary embolus and the inherent risks with general anesthesia.   For shockwave lithotripsy I described the risks which include arrhythmia, kidney contusion, kidney hemorrhage,  need for transfusion, pain, inability to adequately break up stone, inability to pass stone fragments, Steinstrasse, infection associated with obstructing stones, need for alternate surgical procedure, need for repeat shockwave lithotripsy, MI, CVA, PE and the inherent risks with anesthesia/conscious sedation.   For PCNL I described the risks including positioning injury, pneumothorax, hydrothorax, need for chest tube, inability to clear stone burden, renal laceration, arterial venous fistula or malformation, need for embolization of kidney, loss of kidney or renal function, need for repeat procedure, need for prolonged nephrostomy tube, ureteral avulsion, MI, CVA, PE and the inherent risks of general anesthesia.   - The patient would like to proceed with left ESWL.   - Urinalysis, Routine w reflex microscopic   No follow-ups on file.  Wilkie Aye, MD  [facility] [place]

## 2020-09-16 NOTE — Patient Instructions (Signed)
Michael Jenkins  09/16/2020     @PREFPERIOPPHARMACY @   Your procedure is scheduled on  09/20/2020.   Report to 09/22/2020 at  0730  A.M.   Call this number if you have problems the morning of surgery:  [phone]   Remember:  Do not eat or drink after midnight.                       Take these medicines the morning of surgery with A SIP OF WATER  Zyrtec, oxycodone (if needed), flomax.    Please brush your teeth.  Do not wear jewelry, make-up or nail polish.  Do not wear lotions, powders, or perfumes, or deodorant.  Do not shave 48 hours prior to surgery.  Men may shave face and neck.  Do not bring valuables to the hospital.  [facility] is not responsible for any belongings or valuables.  Contacts, dentures or bridgework may not be worn into surgery.  Leave your suitcase in the car.  After surgery it may be brought to your room.  For patients admitted to the hospital, discharge time will be determined by your treatment team.  Patients discharged the day of surgery will not be allowed to drive home and must have someone with them for 24 hours.   Special instructions:  DO NOT smoke tobacco or vape for 24 hours before your procedure.  Please read over the following fact sheets that you were given. Anesthesia Post-op Instructions and Care and Recovery After Surgery      Lithotripsy, Care After This sheet gives you information about how to care for yourself after your procedure. Your health care provider may also give you more specific instructions. If you have problems or questions, contact your health care provider. What can I expect after the procedure? After the procedure, it is common to have:  Some blood in your urine. This should only last for a few days.  Soreness in your back, sides, or upper abdomen for a few days.  Blotches or bruises on the area where the shock wave entered the skin.  Pain, discomfort, or nausea when pieces (fragments) of the kidney  stone move through the tube that carries urine from the kidney to the bladder (ureter). Stone fragments may pass soon after the procedure, but they may continue to pass for up to 4-8 weeks. ? If you have severe pain or nausea, contact your health care provider. This may be caused by a large stone that was not broken up, and this may mean that you need more treatment.  Some pain or discomfort during urination.  Some pain or discomfort in the lower abdomen or (in men) at the base of the penis. Follow these instructions at home: Medicines  Take over-the-counter and prescription medicines only as told by your health care provider.  If you were prescribed an antibiotic medicine, take it as told by your health care provider. Do not stop taking the antibiotic even if you start to feel better.  Ask your health care provider if the medicine prescribed to you requires you to avoid driving or using machinery. Eating and drinking  Drink enough fluid to keep your urine pale yellow. This helps any remaining pieces of the stone to pass. It can also help prevent new stones from forming.  Eat plenty of fresh fruits and vegetables.  Follow instructions from your health care provider about eating or drinking restrictions. You may be instructed to: ?  Reduce how much salt (sodium) you eat or drink. Check ingredients and nutrition facts on packaged foods and beverages to see how much sodium they contain. ? Reduce how much meat you eat.  Eat the recommended amount of calcium for your age and gender. Ask your health care provider how much calcium you should have.      General instructions  Get plenty of rest.  Return to your normal activities as told by your health care provider. Ask your health care provider what activities are safe for you. Most people can resume normal activities 1-2 days after the procedure.  If you were given a sedative during the procedure, it can affect you for several hours. Do not  drive or operate machinery until your health care provider says that it is safe.  Your health care provider may direct you to lie in a certain position (postural drainage) and tap firmly (percuss) over your kidney area to help stone fragments pass. Follow instructions as told by your health care provider.  If directed, strain all urine through the strainer that was provided by your health care provider. ? Keep all fragments for your health care provider to see. Any stones that are found may be sent to a medical lab for examination. The stone may be as small as a grain of salt.  Keep all follow-up visits as told by your health care provider. This is important. Contact a health care provider if:  You have a fever or chills.  You have nausea that is severe or does not go away.  You have any of these urinary symptoms: ? Blood in your urine for longer than your health care provider told you to expect. ? Urine that smells bad or unusual. ? Feeling a strong urge to urinate after emptying your bladder. ? Pain or burning with urination that does not go away. ? Urinating more often than usual and this does not go away.  You have a stent and it comes out. Get help right away if:  You have severe pain in your back, sides, or upper abdomen.  You have any of these urinary symptoms: ? Severe pain while urinating. ? More blood in your urine or having blood in your urine when you did not before. ? Passing blood clots in your urine. ? Passing only a small amount of urine or being unable to pass any urine at all.  You have severe nausea that leads to persistent vomiting.  You faint. Summary  After this procedure, it is common to have some pain, discomfort, or nausea when pieces (fragments) of the kidney stone move through the tube that carries urine from the kidney to the bladder (ureter). If this pain or nausea is severe, however, you should contact your health care provider.  Return to your  normal activities as told by your health care provider. Ask your health care provider what activities are safe for you.  Drink enough fluid to keep your urine pale yellow. This helps any remaining pieces of the stone to pass, and it can help prevent new stones from forming.  If directed, strain your urine and keep all fragments for your health care provider to see. Fragments or stones may be as small as a grain of salt.  Get help right away if you have severe pain in your back, sides, or upper abdomen, or if you have severe pain while urinating. This information is not intended to replace advice given to you by your health care  provider. Make sure you discuss any questions you have with your health care provider. Document Revised: 02/04/2019 Document Reviewed: 02/04/2019 Elsevier Patient Education  2021 [employer].

## 2020-09-16 NOTE — Progress Notes (Signed)
Urological Symptom Review  Patient is experiencing the following symptoms: Kidney stones   Review of Systems  Gastrointestinal (upper)  : Negative for upper GI symptoms  Gastrointestinal (lower) : Negative for lower GI symptoms  Constitutional : Negative for symptoms  Skin: Negative for skin symptoms  Eyes: Negative for eye symptoms  Ear/Nose/Throat : Negative for Ear/Nose/Throat symptoms  Hematologic/Lymphatic: Negative for Hematologic/Lymphatic symptoms  Cardiovascular : Negative for cardiovascular symptoms  Respiratory : Negative for respiratory symptoms  Endocrine: Negative for endocrine symptoms  Musculoskeletal: Back pain Joint pain  Neurological: Negative for neurological symptoms  Psychologic: Negative for psychiatric symptoms  

## 2020-09-19 ENCOUNTER — Encounter (HOSPITAL_COMMUNITY): Payer: Self-pay

## 2020-09-19 ENCOUNTER — Other Ambulatory Visit: Payer: Self-pay

## 2020-09-19 ENCOUNTER — Other Ambulatory Visit (HOSPITAL_COMMUNITY)
Admission: RE | Admit: 2020-09-19 | Discharge: 2020-09-19 | Disposition: A | Discharge status: Home / Self Care | Payer: BC Managed Care – PPO | Source: Ambulatory Visit | Attending: Urology | Admitting: Urology

## 2020-09-19 ENCOUNTER — Encounter (HOSPITAL_COMMUNITY)
Admission: RE | Admit: 2020-09-19 | Discharge: 2020-09-19 | Disposition: A | Discharge status: Home / Self Care | Payer: BC Managed Care – PPO | Source: Ambulatory Visit | Attending: Urology | Admitting: Urology

## 2020-09-19 DIAGNOSIS — Z20822 Contact with and (suspected) exposure to covid-19: Secondary | ICD-10-CM | POA: Insufficient documentation

## 2020-09-19 DIAGNOSIS — Z01812 Encounter for preprocedural laboratory examination: Secondary | ICD-10-CM | POA: Insufficient documentation

## 2020-09-19 DIAGNOSIS — N200 Calculus of kidney: Secondary | ICD-10-CM | POA: Diagnosis not present

## 2020-09-19 DIAGNOSIS — Z888 Allergy status to other drugs, medicaments and biological substances status: Secondary | ICD-10-CM | POA: Diagnosis not present

## 2020-09-19 DIAGNOSIS — Z79899 Other long term (current) drug therapy: Secondary | ICD-10-CM | POA: Diagnosis not present

## 2020-09-19 LAB — SARS CORONAVIRUS 2 (TAT 6-24 HRS): SARS Coronavirus 2: NEGATIVE

## 2020-09-20 ENCOUNTER — Encounter (HOSPITAL_COMMUNITY)
Admission: RE | Disposition: A | Discharge status: Home / Self Care | Payer: Self-pay | Source: Home / Self Care | Attending: Urology

## 2020-09-20 ENCOUNTER — Ambulatory Visit (HOSPITAL_COMMUNITY)
Admission: RE | Admit: 2020-09-20 | Discharge: 2020-09-20 | Disposition: A | Discharge status: Home / Self Care | Payer: BC Managed Care – PPO | Attending: Urology | Admitting: Urology

## 2020-09-20 DIAGNOSIS — N200 Calculus of kidney: Secondary | ICD-10-CM | POA: Insufficient documentation

## 2020-09-20 DIAGNOSIS — Z79899 Other long term (current) drug therapy: Secondary | ICD-10-CM | POA: Insufficient documentation

## 2020-09-20 DIAGNOSIS — Z888 Allergy status to other drugs, medicaments and biological substances status: Secondary | ICD-10-CM | POA: Insufficient documentation

## 2020-09-20 DIAGNOSIS — Z20822 Contact with and (suspected) exposure to covid-19: Secondary | ICD-10-CM | POA: Insufficient documentation

## 2020-09-20 HISTORY — PX: EXTRACORPOREAL SHOCK WAVE LITHOTRIPSY: SHX1557

## 2020-09-20 SURGERY — LITHOTRIPSY, ESWL
Anesthesia: LOCAL | Laterality: Left

## 2020-09-20 MED ORDER — ONDANSETRON HCL 4 MG PO TABS: 4.0000 mg | ORAL_TABLET | Freq: Every day | ORAL | 1 refills | Status: DC | PRN

## 2020-09-20 MED ORDER — TAMSULOSIN HCL 0.4 MG PO CAPS: 0.4000 mg | ORAL_CAPSULE | Freq: Every day | ORAL | 0 refills | Status: DC

## 2020-09-20 MED ORDER — OXYCODONE HCL 5 MG PO TABS: 5.0000 mg | ORAL_TABLET | ORAL | 0 refills | Status: DC | PRN

## 2020-09-20 MED ORDER — SODIUM CHLORIDE 0.9 % IV SOLN: Freq: Once | INTRAVENOUS | Status: AC

## 2020-09-20 MED ORDER — DIPHENHYDRAMINE HCL 25 MG PO CAPS
25.0000 mg | ORAL_CAPSULE | ORAL | Status: AC
Start: 2020-09-20 — End: 2020-09-20
  Administered 2020-09-20: 25 mg via ORAL

## 2020-09-20 MED ORDER — DIAZEPAM 5 MG PO TABS
10.0000 mg | ORAL_TABLET | Freq: Once | ORAL | Status: AC
  Administered 2020-09-20: 10 mg via ORAL

## 2020-09-20 NOTE — Discharge Instructions (Signed)

## 2020-09-20 NOTE — Interval H&P Note (Signed)
History and Physical Interval Note:  09/20/2020 8:44 AM  Michael Jenkins  has presented today for surgery, with the diagnosis of left ureteral calculus.  The various methods of treatment have been discussed with the patient and family. After consideration of risks, benefits and other options for treatment, the patient has consented to  Procedure(s): EXTRACORPOREAL SHOCK WAVE LITHOTRIPSY (ESWL) (Left) as a surgical intervention.  The patient's history has been reviewed, patient examined, no change in status, stable for surgery.  I have reviewed the patient's chart and labs.  Questions were answered to the patient's satisfaction.     Wilkie Aye

## 2020-09-21 ENCOUNTER — Emergency Department (HOSPITAL_COMMUNITY): Payer: BC Managed Care – PPO

## 2020-09-21 ENCOUNTER — Other Ambulatory Visit: Payer: Self-pay

## 2020-09-21 ENCOUNTER — Encounter (HOSPITAL_COMMUNITY): Payer: Self-pay

## 2020-09-21 ENCOUNTER — Emergency Department (HOSPITAL_COMMUNITY)
Admission: EM | Admit: 2020-09-21 | Discharge: 2020-09-21 | Disposition: A | Discharge status: Home / Self Care | Payer: BC Managed Care – PPO | Attending: Emergency Medicine | Admitting: Emergency Medicine

## 2020-09-21 DIAGNOSIS — Y9389 Activity, other specified: Secondary | ICD-10-CM | POA: Insufficient documentation

## 2020-09-21 DIAGNOSIS — W208XXA Other cause of strike by thrown, projected or falling object, initial encounter: Secondary | ICD-10-CM | POA: Insufficient documentation

## 2020-09-21 DIAGNOSIS — S92334A Nondisplaced fracture of third metatarsal bone, right foot, initial encounter for closed fracture: Secondary | ICD-10-CM | POA: Insufficient documentation

## 2020-09-21 DIAGNOSIS — S99921A Unspecified injury of right foot, initial encounter: Secondary | ICD-10-CM | POA: Diagnosis present

## 2020-09-21 MED ORDER — HYDROMORPHONE HCL 1 MG/ML IJ SOLN
0.5000 mg | Freq: Once | INTRAMUSCULAR | Status: AC
  Administered 2020-09-21: 0.5 mg via INTRAMUSCULAR
  Filled 2020-09-21: qty 1

## 2020-09-21 NOTE — ED Triage Notes (Signed)
Pt to er, pt states that he was working on his front end loader, states that the bucket fell and landed on his R foot, pt c/o R foot pain.

## 2020-09-21 NOTE — Discharge Instructions (Signed)
Please read and follow all provided instructions.  You have been seen today for foot fracture, I want you to use the attached instructions and also follow-up with the orthopedic doctor, his information is in the packet.  Please get an appointment as indicated.  Take Tylenol directed on the bottle for pain and you can take your oxycodone every 8 hours for severe pain.  If you have any new worsening concerning symptom please come back to the ER.  Home care instructions: -- *PRICE in the first 24-48 hours after injury: Protect (with brace, splint, sling), if given by your provider Rest Ice- Do not apply ice pack directly to your skin, place towel or similar between your skin and ice/ice pack. Apply ice for 20 min, then remove for 40 min while awake Compression- Wear brace, elastic bandage, splint as directed by your provider Elevate affected extremity above the level of your heart when not walking around for the first 24-48 hours   Use Ibuprofen (Motrin/Advil) 600mg  every 6 hours as needed for pain (do not exceed max dose in 24 hours, 2400mg )  Follow-up instructions: Please follow-up with your primary care provider or the provided orthopedic physician (bone specialist) if you continue to have significant pain in 1 week. In this case you may have a more severe injury that requires further care.   Return instructions:  Please return if your toes or feet are numb or tingling, appear gray or blue, or you have severe pain (also elevate the leg and loosen splint or wrap if you were given one) Please return to the Emergency Department if you experience worsening symptoms.  Please return if you have any other emergent concerns. Additional Information:  Your vital signs today were: BP 132/88 (BP Location: Right Arm)   Pulse 91   Temp 98.4 F (36.9 C) (Oral)   Resp 17   Ht 5\' 7"  (1.702 m)   Wt 90.7 kg   SpO2 97%   BMI 31.32 kg/m  If your blood pressure (BP) was elevated above 135/85 this visit,  please have this repeated by your doctor within one month. ---------------

## 2020-09-21 NOTE — ED Provider Notes (Signed)
[facility] EMERGENCY DEPARTMENT Provider Note   CSN: [id number] Arrival date & time: 09/21/20  1041     History Chief Complaint  Patient presents with  . Foot Injury    Michael Jenkins is a 35 y.o. male with no prior past medical history that presents to the emergency department today for foot injury.  Patient states that he was working on his front end loader, states that a 400 pound steel cutter landed on his foot. Flat bottom.   States that it primarily got his right foot, partially got some of his left foot.  Patient states that he was able to quickly remove his feet, primarily complaining of right foot pain.  States that he was unable to walk after this.  Denies any blood thinners.  States that he was in his normal health before this.  Did not injure himself elsewhere.  Has not take anything for the pain.  States that the pain is about a 7 out of 10 on his right foot, does not radiate anywhere.  Denies any numbness or tingling, able to move foot.    Past Medical History:  Diagnosis Date  . Arrhythmia   . Chronic back pain   . Irregular heartbeat     Patient Active Problem List   Diagnosis Date Noted  . Rectal bleeding 06/08/2016    Past Surgical History:  Procedure Laterality Date  . COLONOSCOPY WITH PROPOFOL N/A 08/22/2016   Procedure: COLONOSCOPY WITH PROPOFOL;  Surgeon: Earline Mayotte, MD;  Location: [facility] ENDOSCOPY;  Service: Endoscopy;  Laterality: N/A;  . HERNIA REPAIR  1986, 2005   umbilical   . KNEE SURGERY         Family History  Problem Relation Age of Onset  . Colon cancer Mother   . Hyperlipidemia Mother   . Diabetes Mother        borderline  . Lung cancer Father     Social History   Tobacco Use  . Smoking status: Never Smoker  . Smokeless tobacco: Never Used  Vaping Use  . Vaping Use: Never used  Substance Use Topics  . Alcohol use: Yes    Comment: rare  . Drug use: No    Home Medications Prior to Admission medications   Medication  Sig Start Date End Date Taking? Authorizing Provider  acetaminophen (TYLENOL) 650 MG CR tablet Take 650 mg by mouth every 8 (eight) hours as needed for pain.    [provider]  cetirizine (ZYRTEC) 10 MG tablet Take 10 mg by mouth daily.    [provider]  ibuprofen (ADVIL) 800 MG tablet Take 800 mg by mouth every 8 (eight) hours as needed.    [provider]  naproxen (NAPROSYN) 500 MG tablet Take 1 tablet (500 mg total) by mouth 2 (two) times daily. 09/14/20   Sabas Sous, MD  ondansetron (ZOFRAN) 4 MG tablet Take 1 tablet (4 mg total) by mouth daily as needed for nausea or vomiting. 09/20/20 09/20/21  McKenzie, Mardene Celeste, MD  oxyCODONE (ROXICODONE) 5 MG immediate release tablet Take 1 tablet (5 mg total) by mouth every 4 (four) hours as needed for severe pain. 09/20/20   McKenzie, Mardene Celeste, MD  tamsulosin (FLOMAX) 0.4 MG CAPS capsule Take 1 capsule (0.4 mg total) by mouth daily. 09/20/20   McKenzie, Mardene Celeste, MD    Allergies    Methylprednisolone and Azithromycin  Review of Systems   Review of Systems  Constitutional: Negative for diaphoresis, fatigue and  fever.  Eyes: Negative for visual disturbance.  Respiratory: Negative for shortness of breath.   Cardiovascular: Negative for chest pain.  Gastrointestinal: Negative for nausea and vomiting.  Musculoskeletal: Positive for arthralgias and myalgias. Negative for back pain.       Foot pain   Skin: Negative for color change, pallor, rash and wound.  Neurological: Negative for syncope, weakness, light-headedness, numbness and headaches.  Psychiatric/Behavioral: Negative for behavioral problems and confusion.    Physical Exam Updated Vital Signs BP 132/88 (BP Location: Right Arm)   Pulse 91   Temp 98.4 F (36.9 C) (Oral)   Resp 17   Ht 5\' 7"  (1.702 m)   Wt 90.7 kg   SpO2 97%   BMI 31.32 kg/m   Physical Exam Constitutional:      General: He is not in acute distress.    Appearance: Normal  appearance. He is not ill-appearing, toxic-appearing or diaphoretic.  Cardiovascular:     Rate and Rhythm: Normal rate and regular rhythm.     Pulses: Normal pulses.  Pulmonary:     Effort: Pulmonary effort is normal.     Breath sounds: Normal breath sounds.  Musculoskeletal:        General: Normal range of motion.       Feet:  Feet:     Comments: Right foot with large hematoma to midfoot, normal range of motion to toes.  PT pulse 2+ no open fracture, no abrasions.  Normal range of motion to toes and ankle.  Left foot with tenderness to right big toe, no tenderness to anywhere else on the foot.  Normal range of motion to all toes including all joints and big toe.  Normal range of motion to ankle.  PT pulse 2+. Skin:    General: Skin is warm and dry.     Capillary Refill: Capillary refill takes less than 2 seconds.  Neurological:     General: No focal deficit present.     Mental Status: He is alert and oriented to person, place, and time.  Psychiatric:        Mood and Affect: Mood normal.        Behavior: Behavior normal.        Thought Content: Thought content normal.     ED Results / Procedures / Treatments   Labs (all labs ordered are listed, but only abnormal results are displayed) Labs Reviewed - No data to display  EKG None  Radiology DG Foot Complete Left  Result Date: 09/21/2020 CLINICAL DATA:  Bilateral foot pain after injury. EXAM: LEFT FOOT - COMPLETE 3+ VIEW COMPARISON:  None. FINDINGS: There is no evidence of fracture or dislocation. There is no evidence of arthropathy or other focal bone abnormality. Soft tissues are unremarkable. IMPRESSION: Negative. Electronically Signed   By: 09/23/2020 M.D.   On: 09/21/2020 12:22   DG Foot Complete Right  Result Date: 09/21/2020 CLINICAL DATA:  Bilateral foot pain after injury. EXAM: RIGHT FOOT COMPLETE - 3+ VIEW COMPARISON:  None. FINDINGS: Minimally displaced oblique fracture is seen involving the third metatarsal.  Mild dorsal soft tissue swelling is noted. Joint spaces are intact. IMPRESSION: Minimally displaced third metatarsal fracture. Electronically Signed   By: 09/23/2020 M.D.   On: 09/21/2020 12:24    Procedures Procedures   Medications Ordered in ED Medications  HYDROmorphone (DILAUDID) injection 0.5 mg (0.5 mg Intramuscular Given 09/21/20 1121)    ED Course  I have reviewed the triage vital signs and the  nursing notes.  Pertinent labs & imaging results that were available during my care of the patient were reviewed by me and considered in my medical decision making (see chart for details).    MDM Rules/Calculators/A&P                         Michael Jenkins is a 35 y.o. male with no prior past medical history that presents to the emergency department today for foot injury.  Right foot does appear edematous with some ecchymosis, good pulses.  Patient is distally neurovascularly intact, however high suspicion for fracture at this time due to mechanism.   Plain films do show a right third nondisplaced metatarsal fracture, otherwise no other abnormalities.  Left plain films negative.  Will place in nonweightbearing boot and give crutches at this time.  Patient states that he already has crutches, does not want any new ones here.  Patient also states that he has oxycodone at home, does not want any here.  We will have patient follow-up with orthopedics.  Patient agreeable with plan, discharged at this time.  Doubt need for further emergent work up at this time. I explained the diagnosis and have given explicit precautions to return to the ER including for any other new or worsening symptoms. The patient understands and accepts the medical plan as it's been dictated and I have answered their questions. Discharge instructions concerning home care and prescriptions have been given. The patient is STABLE and is discharged to home in good condition.   Final Clinical Impression(s) / ED  Diagnoses Final diagnoses:  Nondisplaced fracture of third metatarsal bone, right foot, initial encounter for closed fracture    Rx / DC Orders ED Discharge Orders    None       Farrel Gordon, PA-C 09/21/20 1355    Pollyann Savoy, MD 09/21/20 [phone]

## 2020-09-21 NOTE — ED Notes (Signed)
Ice pack applied to right foot.

## 2020-09-22 ENCOUNTER — Telehealth: Payer: Self-pay | Admitting: Orthopedic Surgery

## 2020-09-22 NOTE — Telephone Encounter (Signed)
Advait called and left a message on our voicemail this morning stating he needed an appointment with our office.  I called him back at 11:15 to schedule him an appointment with our office.   He stated he already had gotten an appointment with a doctor in [place].  He did not tell him the physician's name but he did thank me for calling him back.

## 2020-09-26 ENCOUNTER — Ambulatory Visit (HOSPITAL_COMMUNITY)
Admission: RE | Admit: 2020-09-26 | Discharge: 2020-09-26 | Disposition: A | Discharge status: Home / Self Care | Payer: BC Managed Care – PPO | Source: Ambulatory Visit | Attending: Internal Medicine | Admitting: Internal Medicine

## 2020-09-26 DIAGNOSIS — R1011 Right upper quadrant pain: Secondary | ICD-10-CM | POA: Diagnosis present

## 2020-09-26 DIAGNOSIS — Z0000 Encounter for general adult medical examination without abnormal findings: Secondary | ICD-10-CM | POA: Insufficient documentation

## 2020-10-04 ENCOUNTER — Telehealth (INDEPENDENT_AMBULATORY_CARE_PROVIDER_SITE_OTHER): Payer: BC Managed Care – PPO | Admitting: Urology

## 2020-10-04 ENCOUNTER — Other Ambulatory Visit: Payer: Self-pay

## 2020-10-04 ENCOUNTER — Ambulatory Visit (HOSPITAL_COMMUNITY)
Admission: RE | Admit: 2020-10-04 | Discharge: 2020-10-04 | Disposition: A | Discharge status: Home / Self Care | Payer: BC Managed Care – PPO | Source: Ambulatory Visit | Attending: Urology | Admitting: Urology

## 2020-10-04 ENCOUNTER — Encounter: Payer: Self-pay | Admitting: Urology

## 2020-10-04 DIAGNOSIS — N200 Calculus of kidney: Secondary | ICD-10-CM | POA: Diagnosis present

## 2020-10-04 MED ORDER — TAMSULOSIN HCL 0.4 MG PO CAPS: 0.4000 mg | ORAL_CAPSULE | Freq: Every day | ORAL | 0 refills | Status: DC

## 2020-10-04 NOTE — Patient Instructions (Signed)

## 2020-10-04 NOTE — Progress Notes (Signed)
10/04/2020 2:21 PM   Bennett Scrape 1985-07-26 [id number]  Referring provider: [facility] [address] [place],  [place] [address]   Patient location: home Physician location: office I connected with  Michael Jenkins on 10/04/20 by a video enabled telemedicine application and verified that I am speaking with the correct person using two identifiers.   I discussed the limitations of evaluation and management by telemedicine. The patient expressed understanding and agreed to proceed.   Nephrolithiasis  HPI: Mr Michael Jenkins is a 35yo here for followup for nephrolithiasis. KUB from today shows residual 44mm left UPJ calculus. No flank pain. No LUTS. No other complaints today   PMH: Past Medical History:  Diagnosis Date  . Arrhythmia   . Chronic back pain   . Irregular heartbeat     Surgical History: Past Surgical History:  Procedure Laterality Date  . COLONOSCOPY WITH PROPOFOL N/A 08/22/2016   Procedure: COLONOSCOPY WITH PROPOFOL;  Surgeon: Earline Mayotte, MD;  Location: [facility] ENDOSCOPY;  Service: Endoscopy;  Laterality: N/A;  . EXTRACORPOREAL SHOCK WAVE LITHOTRIPSY Left 09/20/2020   Procedure: EXTRACORPOREAL SHOCK WAVE LITHOTRIPSY (ESWL);  Surgeon: Malen Gauze, MD;  Location: [facility] ORS;  Service: Urology;  Laterality: Left;  . HERNIA REPAIR  1986, 2005   umbilical   . KNEE SURGERY      Home Medications:  Allergies as of 10/04/2020      Reactions   Methylprednisolone Hives   ALLERGY TO STEROID DOSE PACK Other reaction(s): Other (See Comments), Other (See Comments), Other (See Comments) ALLERGY TO STEROID DOSE PACK While taking dose pack any time water (rain, shower water) would contact skin, it felt like needles. ALLERGY TO STEROID DOSE PACK While taking dose pack any time water (rain, shower water) would contact skin, it felt like needles. ALLERGY TO STEROID DOSE PACK ALLERGY TO STEROID DOSE PACK While taking dose pack any time water (rain, shower water)  would contact skin, it felt like needles. ALLERGY TO STEROID DOSE PACK ALLERGY TO STEROID DOSE PACK While taking dose pack any time water (rain, shower water) would contact skin, it felt like needles. While taking dose pack any time water (rain, shower water) would contact skin, it felt like needles. ALLERGY TO STEROID DOSE PACK   Azithromycin    Other reaction(s): Other (See Comments), Other (See Comments) Sensitive skin Sensitive skin      Medication List       Accurate as of Oct 04, 2020  2:21 PM. If you have any questions, ask your nurse or doctor.        acetaminophen 650 MG CR tablet Commonly known as: TYLENOL Take 650 mg by mouth every 8 (eight) hours as needed for pain.   cetirizine 10 MG tablet Commonly known as: ZYRTEC Take 10 mg by mouth daily.   ibuprofen 800 MG tablet Commonly known as: ADVIL Take 800 mg by mouth every 8 (eight) hours as needed.   naproxen 500 MG tablet Commonly known as: NAPROSYN Take 1 tablet (500 mg total) by mouth 2 (two) times daily.   ondansetron 4 MG tablet Commonly known as: Zofran Take 1 tablet (4 mg total) by mouth daily as needed for nausea or vomiting.   oxyCODONE 5 MG immediate release tablet Commonly known as: Roxicodone Take 1 tablet (5 mg total) by mouth every 4 (four) hours as needed for severe pain.   tamsulosin 0.4 MG Caps capsule Commonly known as: Flomax Take 1 capsule (0.4 mg total) by mouth daily.  Allergies:  Allergies  Allergen Reactions  . Methylprednisolone Hives    ALLERGY TO STEROID DOSE PACK Other reaction(s): Other (See Comments), Other (See Comments), Other (See Comments) ALLERGY TO STEROID DOSE PACK While taking dose pack any time water (rain, shower water) would contact skin, it felt like needles. ALLERGY TO STEROID DOSE PACK While taking dose pack any time water (rain, shower water) would contact skin, it felt like needles. ALLERGY TO STEROID DOSE PACK ALLERGY TO STEROID DOSE PACK While  taking dose pack any time water (rain, shower water) would contact skin, it felt like needles. ALLERGY TO STEROID DOSE PACK ALLERGY TO STEROID DOSE PACK While taking dose pack any time water (rain, shower water) would contact skin, it felt like needles. While taking dose pack any time water (rain, shower water) would contact skin, it felt like needles. ALLERGY TO STEROID DOSE PACK   . Azithromycin     Other reaction(s): Other (See Comments), Other (See Comments) Sensitive skin Sensitive skin     Family History: Family History  Problem Relation Age of Onset  . Colon cancer Mother   . Hyperlipidemia Mother   . Diabetes Mother        borderline  . Lung cancer Father     Social History:  reports that he has never smoked. He has never used smokeless tobacco. He reports current alcohol use. He reports that he does not use drugs.  ROS: All other review of systems were reviewed and are negative except what is noted above in HPI  Physical Exam: There were no vitals taken for this visit.  Constitutional:  Alert and oriented, No acute distress. HEENT: [place] AT, moist mucus membranes.  Trachea midline, no masses. Cardiovascular: No clubbing, cyanosis, or edema. Respiratory: Normal respiratory effort, no increased work of breathing. GI: Abdomen is soft, nontender, nondistended, no abdominal masses GU: No CVA tenderness.  Lymph: No cervical or inguinal lymphadenopathy. Skin: No rashes, bruises or suspicious lesions. Neurologic: Grossly intact, no focal deficits, moving all 4 extremities. Psychiatric: Normal mood and affect.  Laboratory Data: Lab Results  Component Value Date   WBC 6.2 09/14/2020   HGB 17.0 09/14/2020   HCT 49.0 09/14/2020   MCV 89.4 09/14/2020   PLT 185 09/14/2020    Lab Results  Component Value Date   CREATININE 0.91 09/14/2020    No results found for: PSA  No results found for: TESTOSTERONE  No results found for: HGBA1C  Urinalysis    Component Value  Date/Time   COLORURINE YELLOW 09/14/2020 0445   APPEARANCEUR Clear 09/16/2020 1112   LABSPEC 1.012 09/14/2020 0445   PHURINE 6.0 09/14/2020 0445   GLUCOSEU Negative 09/16/2020 1112   HGBUR LARGE (A) 09/14/2020 0445   BILIRUBINUR Negative 09/16/2020 1112   KETONESUR NEGATIVE 09/14/2020 0445   PROTEINUR Negative 09/16/2020 1112   PROTEINUR NEGATIVE 09/14/2020 0445   NITRITE Negative 09/16/2020 1112   NITRITE NEGATIVE 09/14/2020 0445   LEUKOCYTESUR Trace (A) 09/16/2020 1112   LEUKOCYTESUR NEGATIVE 09/14/2020 0445    Lab Results  Component Value Date   LABMICR See below: 09/16/2020   WBCUA 0-5 09/16/2020   LABEPIT None seen 09/16/2020   MUCUS Present 09/16/2020   BACTERIA Few 09/16/2020    Pertinent Imaging: KUB today: Images reviewed and discussed with the patient Results for orders placed in visit on 09/16/20  Abdomen 1 view (KUB)  Narrative CLINICAL DATA:  Left-sided abdominal pain, follow-up left UPJ stone  EXAM: ABDOMEN - 1 VIEW  COMPARISON:  09/14/2020  FINDINGS: Scattered large and small bowel gas is noted. Stable left UPJ stone measuring 10-11 mm is seen. The known lower pole renal stone on the right is obscured by overlying bowel gas. No other focal abnormality is seen.  IMPRESSION: Stable left UPJ stone.   Electronically Signed By: Alcide Clever M.D. On: 09/18/2020 11:11  No results found for this or any previous visit.  No results found for this or any previous visit.  No results found for this or any previous visit.  No results found for this or any previous visit.  No results found for this or any previous visit.  No results found for this or any previous visit.  Results for orders placed during the hospital encounter of 09/14/20  CT RENAL STONE STUDY  Narrative CLINICAL DATA:  Flank pain on the left with kidney stone suspected  EXAM: CT ABDOMEN AND PELVIS WITHOUT CONTRAST  TECHNIQUE: Multidetector CT imaging of the abdomen and  pelvis was performed following the standard protocol without IV contrast.  COMPARISON:  None.  FINDINGS: Lower chest:  Negative  Hepatobiliary: Hepatic steatosis.No evidence of biliary obstruction or stone.  Pancreas: Unremarkable.  Spleen: Unremarkable.  Adrenals/Urinary Tract: Negative adrenals. Mild left hydronephrosis, periureteric stranding, and low-density renal expansion from a UPJ stone which measures 10 x 5 mm. 3 mm stone at the lower pole right kidney. Unremarkable bladder.  Stomach/Bowel:  No obstruction. No appendicitis.  Vascular/Lymphatic: No acute vascular abnormality. No mass or adenopathy.  Reproductive:No pathologic findings.  Other: No ascites or pneumoperitoneum. Shallow fatty right inguinal hernia.  Musculoskeletal: No acute abnormalities.  IMPRESSION: 1. Left urinary obstruction from a 10 x 5 mm UPJ calculus. 2. Small right renal calculus. 3. Hepatic steatosis.   Electronically Signed By: Marnee Spring M.D. On: 09/14/2020 05:24   Assessment & Plan:    1. Kidney stones -Continue medical expulsive therapy. RTC 1 month with KUB   No follow-ups on file.  Wilkie Aye, MD  [facility] [place]

## 2020-12-06 ENCOUNTER — Ambulatory Visit (HOSPITAL_COMMUNITY)
Admission: RE | Admit: 2020-12-06 | Discharge: 2020-12-06 | Disposition: A | Discharge status: Home / Self Care | Payer: BC Managed Care – PPO | Source: Ambulatory Visit | Attending: Urology | Admitting: Urology

## 2020-12-06 ENCOUNTER — Telehealth: Payer: Self-pay

## 2020-12-06 ENCOUNTER — Other Ambulatory Visit: Payer: Self-pay

## 2020-12-06 DIAGNOSIS — N200 Calculus of kidney: Secondary | ICD-10-CM | POA: Insufficient documentation

## 2020-12-06 NOTE — Telephone Encounter (Signed)
KUB already ordered- patient called and notified.

## 2020-12-06 NOTE — Telephone Encounter (Signed)
Patient thinking he needs an order for a KUB and a follow up visit.  Please advise.  Call back: [phone]  Thanks, Rosey Bath

## 2021-01-16 ENCOUNTER — Telehealth: Payer: Self-pay

## 2021-01-16 ENCOUNTER — Other Ambulatory Visit: Payer: Self-pay | Admitting: Urology

## 2021-01-16 DIAGNOSIS — N200 Calculus of kidney: Secondary | ICD-10-CM

## 2021-01-16 MED ORDER — TAMSULOSIN HCL 0.4 MG PO CAPS: 0.4000 mg | ORAL_CAPSULE | Freq: Every day | ORAL | 0 refills | Status: DC

## 2021-01-16 MED ORDER — OXYCODONE HCL 5 MG PO TABS: 5.0000 mg | ORAL_TABLET | ORAL | 0 refills | Status: AC | PRN

## 2021-01-16 MED ORDER — ONDANSETRON HCL 4 MG PO TABS: 4.0000 mg | ORAL_TABLET | Freq: Every day | ORAL | 1 refills | Status: AC | PRN

## 2021-01-16 NOTE — Telephone Encounter (Signed)
-----   Message from Malen Gauze, MD sent at 01/16/2021 11:40 AM EDT ----- KUB shows left 1cm left ureteral calculus. I will send in percocet and flomax. If he wants to proceed with lithotripsy we can get him on the schedule ----- Message ----- From: Ferdinand Lango, RN Sent: 01/16/2021   9:54 AM EDT To: Malen Gauze, MD  Please review

## 2021-01-16 NOTE — Telephone Encounter (Signed)
Patient called and notified. Dates given for litho as 09/20, 10/03 and 10/18. Patient will review work schedule and call us back when ready to move forward.

## 2021-04-20 ENCOUNTER — Ambulatory Visit
Admission: EM | Admit: 2021-04-20 | Discharge: 2021-04-20 | Disposition: A | Discharge status: Home / Self Care | Payer: BC Managed Care – PPO | Attending: Emergency Medicine | Admitting: Emergency Medicine

## 2021-04-20 ENCOUNTER — Other Ambulatory Visit: Payer: Self-pay

## 2021-04-20 ENCOUNTER — Encounter: Payer: Self-pay | Admitting: Emergency Medicine

## 2021-04-20 DIAGNOSIS — J069 Acute upper respiratory infection, unspecified: Secondary | ICD-10-CM | POA: Diagnosis present

## 2021-04-20 DIAGNOSIS — Z20822 Contact with and (suspected) exposure to covid-19: Secondary | ICD-10-CM | POA: Insufficient documentation

## 2021-04-20 LAB — SARS CORONAVIRUS 2 (TAT 6-24 HRS): SARS Coronavirus 2: NEGATIVE

## 2021-04-20 MED ORDER — PROMETHAZINE-DM 6.25-15 MG/5ML PO SYRP: 5.0000 mL | ORAL_SOLUTION | Freq: Four times a day (QID) | ORAL | 0 refills | Status: AC | PRN

## 2021-04-20 NOTE — ED Triage Notes (Signed)
Pt c/o nasal congestion, cough, hoarseness. Started about 3 days ago. Denies fever.

## 2021-04-20 NOTE — Discharge Instructions (Signed)
Rest, push fluids, may take over-the-counter meds for symptom management.  Please check MyChart for results.  Take cough med as directed, follow-up with your PCP

## 2021-04-20 NOTE — ED Provider Notes (Signed)
[facility]    CSN: [id number] Arrival date & time: 04/20/21  3710      History   Chief Complaint Chief Complaint  Patient presents with   Cough     HPI Michael Jenkins is a 35 y.o. male.   35 year old male patient, Michael Jenkins, presents to urgent care with chief complaint of nasal congestion cough hoarseness x3 days.  Patient states he works for [employer], unknown illness exposure. Treating with OTC meds.  The history is provided by the patient. No language interpreter was used.   Past Medical History:  Diagnosis Date   Arrhythmia    Chronic back pain    Irregular heartbeat     Patient Active Problem List   Diagnosis Date Noted   Viral URI 04/20/2021   Rectal bleeding 06/08/2016    Past Surgical History:  Procedure Laterality Date   COLONOSCOPY WITH PROPOFOL N/A 08/22/2016   Procedure: COLONOSCOPY WITH PROPOFOL;  Surgeon: Earline Mayotte, MD;  Location: [facility] ENDOSCOPY;  Service: Endoscopy;  Laterality: N/A;   EXTRACORPOREAL SHOCK WAVE LITHOTRIPSY Left 09/20/2020   Procedure: EXTRACORPOREAL SHOCK WAVE LITHOTRIPSY (ESWL);  Surgeon: Malen Gauze, MD;  Location: [facility];  Service: Urology;  Laterality: Left;   HERNIA REPAIR  1986, 2005   umbilical    KNEE SURGERY         Home Medications    Prior to Admission medications   Medication Sig Start Date End Date Taking? Authorizing Provider  promethazine-dextromethorphan (PROMETHAZINE-DM) 6.25-15 MG/5ML syrup Take 5 mLs by mouth 4 (four) times daily as needed for up to 5 days for cough. 04/20/21 04/25/21 Yes Roderick Sweezy, Para March, NP  acetaminophen (TYLENOL) 650 MG CR tablet Take 650 mg by mouth every 8 (eight) hours as needed for pain.    [provider]  cetirizine (ZYRTEC) 10 MG tablet Take 10 mg by mouth daily.    [provider]  ibuprofen (ADVIL) 800 MG tablet Take 800 mg by mouth every 8 (eight) hours as needed.    [provider]  naproxen  (NAPROSYN) 500 MG tablet Take 1 tablet (500 mg total) by mouth 2 (two) times daily. 09/14/20   Sabas Sous, MD  ondansetron (ZOFRAN) 4 MG tablet Take 1 tablet (4 mg total) by mouth daily as needed for nausea or vomiting. 01/16/21 01/16/22  McKenzie, Mardene Celeste, MD  oxyCODONE (ROXICODONE) 5 MG immediate release tablet Take 1 tablet (5 mg total) by mouth every 4 (four) hours as needed for severe pain. 01/16/21   McKenzie, Mardene Celeste, MD  tamsulosin (FLOMAX) 0.4 MG CAPS capsule Take 1 capsule (0.4 mg total) by mouth daily. 01/16/21   McKenzie, Mardene Celeste, MD    Family History Family History  Problem Relation Age of Onset   Colon cancer Mother    Hyperlipidemia Mother    Diabetes Mother        borderline   Lung cancer Father     Social History Social History   Tobacco Use   Smoking status: Never   Smokeless tobacco: Never  Vaping Use   Vaping Use: Never used  Substance Use Topics   Alcohol use: Yes    Comment: rare   Drug use: No     Allergies   Methylprednisolone and Azithromycin   Review of Systems Review of Systems  HENT:  Positive for congestion.   Respiratory:  Positive for cough.   All other systems reviewed and are negative.   Physical Exam Triage Vital Signs  ED Triage Vitals  Enc Vitals Group     BP      Pulse      Resp      Temp      Temp src      SpO2      Weight      Height      Head Circumference      Peak Flow      Pain Score      Pain Loc      Pain Edu?      Excl. in GC?    No data found.  Updated Vital Signs BP (!) 143/95 (BP Location: Right Arm)    Pulse 87    Temp 98.3 F (36.8 C) (Oral)    Resp 18    Ht 5\' 7"  (1.702 m)    Wt 199 lb 15.3 oz (90.7 kg)    SpO2 99%    BMI 31.32 kg/m   Visual Acuity Right Eye Distance:   Left Eye Distance:   Bilateral Distance:    Right Eye Near:   Left Eye Near:    Bilateral Near:     Physical Exam Vitals and nursing note reviewed.  Constitutional:      General: He is not in acute distress.     Appearance: He is well-developed. He is not ill-appearing or toxic-appearing.  HENT:     Head: Normocephalic.     Right Ear: Tympanic membrane is retracted.     Left Ear: Tympanic membrane is retracted.     Nose: Mucosal edema and congestion present.     Mouth/Throat:     Lips: Pink.     Mouth: Mucous membranes are moist.     Pharynx: Oropharynx is clear. Uvula midline.     Comments: laryngitis Eyes:     General: Lids are normal.     Conjunctiva/sclera: Conjunctivae normal.     Pupils: Pupils are equal, round, and reactive to light.  Cardiovascular:     Rate and Rhythm: Normal rate and regular rhythm.     Pulses: Normal pulses.     Heart sounds: Normal heart sounds.  Pulmonary:     Effort: Pulmonary effort is normal. No respiratory distress.     Breath sounds: Normal breath sounds and air entry. No decreased breath sounds or wheezing.  Abdominal:     General: There is no distension.     Palpations: Abdomen is soft.  Musculoskeletal:        General: Normal range of motion.     Cervical back: Normal range of motion.  Skin:    General: Skin is warm and dry.     Findings: No rash.  Neurological:     General: No focal deficit present.     Mental Status: He is alert and oriented to person, place, and time.     GCS: GCS eye subscore is 4. GCS verbal subscore is 5. GCS motor subscore is 6.     Cranial Nerves: No cranial nerve deficit.     Sensory: No sensory deficit.  Psychiatric:        Attention and Perception: Attention normal.        Mood and Affect: Mood normal.        Speech: Speech normal.        Behavior: Behavior normal. Behavior is cooperative.     UC Treatments / Results  Labs (all labs ordered are listed, but only abnormal results are displayed) Labs Reviewed  SARS CORONAVIRUS  2 (TAT 6-24 HRS)    EKG   Radiology No results found.  Procedures Procedures (including critical care time)  Medications Ordered in UC Medications - No data to  display  Initial Impression / Assessment and Plan / UC Course  I have reviewed the triage vital signs and the nursing notes.  Pertinent labs & imaging results that were available during my care of the patient were reviewed by me and considered in my medical decision making (see chart for details).     Ddx: laryngitis, viral illness(COVID,FLU,RSV) Final Clinical Impressions(s) / UC Diagnoses   Final diagnoses:  Viral URI     Discharge Instructions      Rest, push fluids, may take over-the-counter meds for symptom management.  Please check MyChart for results.  Take cough med as directed, follow-up with your PCP     ED Prescriptions     Medication Sig Dispense Auth. Provider   promethazine-dextromethorphan (PROMETHAZINE-DM) 6.25-15 MG/5ML syrup Take 5 mLs by mouth 4 (four) times daily as needed for up to 5 days for cough. 100 mL Hezakiah Champeau, Para March, NP      PDMP not reviewed this encounter.   Clancy Gourd, NP 04/20/21 [phone]

## 2021-04-28 ENCOUNTER — Ambulatory Visit: Payer: BC Managed Care – PPO | Admitting: Urology

## 2021-05-02 ENCOUNTER — Telehealth: Payer: Self-pay

## 2021-05-02 NOTE — Telephone Encounter (Signed)
I attempted to contact patient to reschedule appt from 04/28/2021. I was unable to reach patient and advised patient to contact us to reschedule with Herma Ard PA.

## 2021-06-21 ENCOUNTER — Ambulatory Visit: Payer: BC Managed Care – PPO | Admitting: Urology

## 2021-06-21 ENCOUNTER — Other Ambulatory Visit: Payer: Self-pay

## 2021-06-21 ENCOUNTER — Encounter: Payer: Self-pay | Admitting: Urology

## 2021-06-21 ENCOUNTER — Ambulatory Visit (HOSPITAL_COMMUNITY)
Admission: RE | Admit: 2021-06-21 | Discharge: 2021-06-21 | Disposition: A | Discharge status: Home / Self Care | Payer: BC Managed Care – PPO | Source: Ambulatory Visit | Attending: Urology | Admitting: Urology

## 2021-06-21 VITALS — BP 124/65 | HR 87

## 2021-06-21 DIAGNOSIS — N200 Calculus of kidney: Secondary | ICD-10-CM

## 2021-06-21 LAB — URINALYSIS, ROUTINE W REFLEX MICROSCOPIC
Bilirubin, UA: NEGATIVE
Glucose, UA: NEGATIVE
Ketones, UA: NEGATIVE
Leukocytes,UA: NEGATIVE
Nitrite, UA: NEGATIVE
Protein,UA: NEGATIVE
Specific Gravity, UA: 1.02 (ref 1.005–1.030)
Urobilinogen, Ur: 0.2 mg/dL (ref 0.2–1.0)
pH, UA: 6 (ref 5.0–7.5)

## 2021-06-21 LAB — MICROSCOPIC EXAMINATION
Bacteria, UA: NONE SEEN
Renal Epithel, UA: NONE SEEN /hpf
WBC, UA: NONE SEEN /hpf (ref 0–5)

## 2021-06-21 MED ORDER — TAMSULOSIN HCL 0.4 MG PO CAPS
0.4000 mg | ORAL_CAPSULE | Freq: Every day | ORAL | 0 refills | Status: AC
Start: 2021-06-21 — End: ?

## 2021-06-21 MED ORDER — TAMSULOSIN HCL 0.4 MG PO CAPS: 0.4000 mg | ORAL_CAPSULE | Freq: Every day | ORAL | 1 refills | Status: AC

## 2021-06-21 NOTE — H&P (View-Only) (Signed)
06/21/2021 2:35 PM   Michael Jenkins Aug 11, 1985 [id number]  Referring provider: [facility] [address] [place],  [place] [address]  nephrolithiasis  HPI: Michael Jenkins is a 36yo here for followup for nephrolithiasis. Starting Sunday he developed left flank pain with associated nausea and vomiting. He presented to [facility] and was diagnosed with a 43mm left ureteral calculus. KUB from today shows a 59mm mid ureteral calculus. Currently he has mild left flank pain alleviated with narcotics. He is out of his flomax.    PMH: Past Medical History:  Diagnosis Date   Arrhythmia    Chronic back pain    Irregular heartbeat     Surgical History: Past Surgical History:  Procedure Laterality Date   COLONOSCOPY WITH PROPOFOL N/A 08/22/2016   Procedure: COLONOSCOPY WITH PROPOFOL;  Surgeon: Earline Mayotte, MD;  Location: [facility] ENDOSCOPY;  Service: Endoscopy;  Laterality: N/A;   EXTRACORPOREAL SHOCK WAVE LITHOTRIPSY Left 09/20/2020   Procedure: EXTRACORPOREAL SHOCK WAVE LITHOTRIPSY (ESWL);  Surgeon: Malen Gauze, MD;  Location: [facility];  Service: Urology;  Laterality: Left;   HERNIA REPAIR  1986, 2005   umbilical    KNEE SURGERY      Home Medications:  Allergies as of 06/21/2021       Reactions   Methylprednisolone Hives   ALLERGY TO STEROID DOSE PACK Other reaction(s): Other (See Comments), Other (See Comments), Other (See Comments) ALLERGY TO STEROID DOSE PACK While taking dose pack any time water (rain, shower water) would contact skin, it felt like needles. ALLERGY TO STEROID DOSE PACK While taking dose pack any time water (rain, shower water) would contact skin, it felt like needles. ALLERGY TO STEROID DOSE PACK ALLERGY TO STEROID DOSE PACK While taking dose pack any time water (rain, shower water) would contact skin, it felt like needles. ALLERGY TO STEROID DOSE PACK ALLERGY TO STEROID DOSE PACK While taking dose pack any time water (rain, shower water)  would contact skin, it felt like needles. While taking dose pack any time water (rain, shower water) would contact skin, it felt like needles. ALLERGY TO STEROID DOSE PACK   Azithromycin    Other reaction(s): Other (See Comments), Other (See Comments) Sensitive skin Sensitive skin        Medication List        Accurate as of June 21, 2021  2:35 PM. If you have any questions, ask your nurse or doctor.          acetaminophen 650 MG CR tablet Commonly known as: TYLENOL Take 650 mg by mouth every 8 (eight) hours as needed for pain.   cetirizine 10 MG tablet Commonly known as: ZYRTEC Take 10 mg by mouth daily.   ibuprofen 800 MG tablet Commonly known as: ADVIL Take 800 mg by mouth every 8 (eight) hours as needed.   naproxen 500 MG tablet Commonly known as: NAPROSYN Take 1 tablet (500 mg total) by mouth 2 (two) times daily.   ondansetron 4 MG disintegrating tablet Commonly known as: ZOFRAN-ODT Take by mouth.   ondansetron 4 MG tablet Commonly known as: Zofran Take 1 tablet (4 mg total) by mouth daily as needed for nausea or vomiting.   oxyCODONE 5 MG immediate release tablet Commonly known as: Roxicodone Take 1 tablet (5 mg total) by mouth every 4 (four) hours as needed for severe pain.   tamsulosin 0.4 MG Caps capsule Commonly known as: Flomax Take 1 capsule (0.4 mg total) by mouth daily.  Allergies:  Allergies  Allergen Reactions   Methylprednisolone Hives    ALLERGY TO STEROID DOSE PACK Other reaction(s): Other (See Comments), Other (See Comments), Other (See Comments) ALLERGY TO STEROID DOSE PACK While taking dose pack any time water (rain, shower water) would contact skin, it felt like needles. ALLERGY TO STEROID DOSE PACK While taking dose pack any time water (rain, shower water) would contact skin, it felt like needles. ALLERGY TO STEROID DOSE PACK ALLERGY TO STEROID DOSE PACK While taking dose pack any time water (rain, shower water)  would contact skin, it felt like needles. ALLERGY TO STEROID DOSE PACK ALLERGY TO STEROID DOSE PACK While taking dose pack any time water (rain, shower water) would contact skin, it felt like needles. While taking dose pack any time water (rain, shower water) would contact skin, it felt like needles. ALLERGY TO STEROID DOSE PACK    Azithromycin     Other reaction(s): Other (See Comments), Other (See Comments) Sensitive skin Sensitive skin     Family History: Family History  Problem Relation Age of Onset   Colon cancer Mother    Hyperlipidemia Mother    Diabetes Mother        borderline   Lung cancer Father     Social History:  reports that he has never smoked. He has never used smokeless tobacco. He reports current alcohol use. He reports that he does not use drugs.  ROS: All other review of systems were reviewed and are negative except what is noted above in HPI  Physical Exam: BP 124/65    Pulse 87   Constitutional:  Alert and oriented, No acute distress. HEENT: [place] AT, moist mucus membranes.  Trachea midline, no masses. Cardiovascular: No clubbing, cyanosis, or edema. Respiratory: Normal respiratory effort, no increased work of breathing. GI: Abdomen is soft, nontender, nondistended, no abdominal masses GU: No CVA tenderness.  Lymph: No cervical or inguinal lymphadenopathy. Skin: No rashes, bruises or suspicious lesions. Neurologic: Grossly intact, no focal deficits, moving all 4 extremities. Psychiatric: Normal mood and affect.  Laboratory Data: Lab Results  Component Value Date   WBC 6.2 09/14/2020   HGB 17.0 09/14/2020   HCT 49.0 09/14/2020   MCV 89.4 09/14/2020   PLT 185 09/14/2020    Lab Results  Component Value Date   CREATININE 0.91 09/14/2020    No results found for: PSA  No results found for: TESTOSTERONE  No results found for: HGBA1C  Urinalysis    Component Value Date/Time   COLORURINE YELLOW 09/14/2020 0445   APPEARANCEUR Clear  09/16/2020 1112   LABSPEC 1.012 09/14/2020 0445   PHURINE 6.0 09/14/2020 0445   GLUCOSEU Negative 09/16/2020 1112   HGBUR LARGE (A) 09/14/2020 0445   BILIRUBINUR Negative 09/16/2020 1112   KETONESUR NEGATIVE 09/14/2020 0445   PROTEINUR Negative 09/16/2020 1112   PROTEINUR NEGATIVE 09/14/2020 0445   NITRITE Negative 09/16/2020 1112   NITRITE NEGATIVE 09/14/2020 0445   LEUKOCYTESUR Trace (A) 09/16/2020 1112   LEUKOCYTESUR NEGATIVE 09/14/2020 0445    Lab Results  Component Value Date   LABMICR See below: 09/16/2020   WBCUA 0-5 09/16/2020   LABEPIT None seen 09/16/2020   MUCUS Present 09/16/2020   BACTERIA Few 09/16/2020    Pertinent Imaging: KUB today: images reviewed and discussed with the patient Results for orders placed during the hospital encounter of 12/06/20  DG Abd 1 View  Narrative CLINICAL DATA:  Nephrolithiasis, history of kidney stones  EXAM: ABDOMEN - 1 VIEW  COMPARISON:  None.  FINDINGS: Nonobstructive pattern of bowel gas. Suspect an elongated calculus projecting along the mid left ureter at the left aspect of L3 measuring 1.1 cm.  IMPRESSION: Suspect an elongated calculus projecting along the mid left ureter at the left aspect of L3 measuring 1.1 cm. This appears to have migrated distally in comparison to prior examination dated 10/04/2020. No additional calculi identified.   Electronically Signed By: Lauralyn Primes M.D. On: 12/07/2020 14:51  No results found for this or any previous visit.  No results found for this or any previous visit.  No results found for this or any previous visit.  No results found for this or any previous visit.  No results found for this or any previous visit.  No results found for this or any previous visit.  Results for orders placed during the hospital encounter of 09/14/20  CT RENAL STONE STUDY  Narrative CLINICAL DATA:  Flank pain on the left with kidney stone suspected  EXAM: CT ABDOMEN AND PELVIS  WITHOUT CONTRAST  TECHNIQUE: Multidetector CT imaging of the abdomen and pelvis was performed following the standard protocol without IV contrast.  COMPARISON:  None.  FINDINGS: Lower chest:  Negative  Hepatobiliary: Hepatic steatosis.No evidence of biliary obstruction or stone.  Pancreas: Unremarkable.  Spleen: Unremarkable.  Adrenals/Urinary Tract: Negative adrenals. Mild left hydronephrosis, periureteric stranding, and low-density renal expansion from a UPJ stone which measures 10 x 5 mm. 3 mm stone at the lower pole right kidney. Unremarkable bladder.  Stomach/Bowel:  No obstruction. No appendicitis.  Vascular/Lymphatic: No acute vascular abnormality. No mass or adenopathy.  Reproductive:No pathologic findings.  Other: No ascites or pneumoperitoneum. Shallow fatty right inguinal hernia.  Musculoskeletal: No acute abnormalities.  IMPRESSION: 1. Left urinary obstruction from a 10 x 5 mm UPJ calculus. 2. Small right renal calculus. 3. Hepatic steatosis.   Electronically Signed By: Marnee Spring M.D. On: 09/14/2020 05:24   Assessment & Plan:    1. Kidney stones -We discussed the management of kidney stones. These options include observation, ureteroscopy, shockwave lithotripsy (ESWL) and percutaneous nephrolithotomy (PCNL). We discussed which options are relevant to the patient's stone(s). We discussed the natural history of kidney stones as well as the complications of untreated stones and the impact on quality of life without treatment as well as with each of the above listed treatments. We also discussed the efficacy of each treatment in its ability to clear the stone burden. With any of these management options I discussed the signs and symptoms of infection and the need for emergent treatment should these be experienced. For each option we discussed the ability of each procedure to clear the patient of their stone burden.   For observation I described the  risks which include but are not limited to silent renal damage, life-threatening infection, need for emergent surgery, failure to pass stone and pain.   For ureteroscopy I described the risks which include bleeding, infection, damage to contiguous structures, positioning injury, ureteral stricture, ureteral avulsion, ureteral injury, need for prolonged ureteral stent, inability to perform ureteroscopy, need for an interval procedure, inability to clear stone burden, stent discomfort/pain, heart attack, stroke, pulmonary embolus and the inherent risks with general anesthesia.   For shockwave lithotripsy I described the risks which include arrhythmia, kidney contusion, kidney hemorrhage, need for transfusion, pain, inability to adequately break up stone, inability to pass stone fragments, Steinstrasse, infection associated with obstructing stones, need for alternate surgical procedure, need for repeat shockwave lithotripsy, MI, CVA, PE and the inherent risks  with anesthesia/conscious sedation.   For PCNL I described the risks including positioning injury, pneumothorax, hydrothorax, need for chest tube, inability to clear stone burden, renal laceration, arterial venous fistula or malformation, need for embolization of kidney, loss of kidney or renal function, need for repeat procedure, need for prolonged nephrostomy tube, ureteral avulsion, MI, CVA, PE and the inherent risks of general anesthesia.   - The patient would like to proceed with left ESWL - DG Abd 1 View   No follow-ups on file.  Wilkie AyePatrick Raechel Marcos, MD  [facility] [place]

## 2021-06-21 NOTE — Patient Instructions (Signed)
Dear Michael Jenkins,   Thank you for choosing [facility] [place] to assist in your urologic care for your upcoming surgery. The following information below includes specific dates and details related to surgery:   The Surgical Procedure you are scheduled to have performed is extracorporeal shockwave lithotripsy   Surgery Date: 02/21/20232   Physician performing the surgery: Dr. Wilkie Aye   Do not eat or drink after midnight the day before your surgery.    You will need a driver the day of surgery and will not be able to operate heavy machinery for 24 hours after.    Your surgery will be performed at  [facility] [address], [place] [address]   Enter at the Main Entrance and check in at the SAME DAY SURGERY desk.     Pre-Admit Testing Info   Pre- Admit appointments are interview with an anesthesiologist or a pre-operative anesthesia nurse. These appointments are typically completed as an in person visit but can take place over the telephone.  You will be contacted to confirm the date and time window.    If you have any questions or concerns, please don't hesitate to call the office at [phone]   Thank you,    Alfredo Martinez, RN Clinical Surgery Coordinator [facility]

## 2021-06-21 NOTE — Progress Notes (Signed)
[facility] Posting Form    Surgery Date/Time: 06/27/2021   Surgeon: Dr. Wilkie Aye  Surgery Location: [facility]  Diagnosis: left ureteral calculus   CPT: 50590   Surgery: left ESWL  Stop Anticoagulations: may stay on ASA   Cardiac/Medical/Pulmonary Clearance needed: n/a   Orders entered into EPIC  Date: 06/21/2021    Case booked in [place]  Date: 06/21/2021   Notified pt of Surgery: Date: 06/21/2021   Placed into Prior Authorization Work Angela Nevin Date:      Assistant/laser/rep:n/a   I spoke with Michael Jenkins. We have discussed possible surgery dates and 06/27/2021 was agreed upon by all parties. Patient given information about surgery date, what to expect pre-operatively and post operatively.    We discussed that a pre-op nurse will be calling to set up the pre-op visit that will take place prior to surgery. Informed patient that our office will communicate any additional care to be provided after surgery.    Patients questions or concerns were discussed during our call. Advised to call our office should there be any additional information, questions or concerns that arise. Patient verbalized understanding.

## 2021-06-21 NOTE — Progress Notes (Signed)
06/21/2021 2:35 PM   Michael Jenkins Aug 11, 1985 [id number]  Referring provider: [facility] [address] [place],  [place] [address]  nephrolithiasis  HPI: Michael Jenkins is a 35yo here for followup for nephrolithiasis. Starting Sunday he developed left flank pain with associated nausea and vomiting. He presented to [facility] and was diagnosed with a 43mm left ureteral calculus. KUB from today shows a 59mm mid ureteral calculus. Currently he has mild left flank pain alleviated with narcotics. He is out of his flomax.    PMH: Past Medical History:  Diagnosis Date   Arrhythmia    Chronic back pain    Irregular heartbeat     Surgical History: Past Surgical History:  Procedure Laterality Date   COLONOSCOPY WITH PROPOFOL N/A 08/22/2016   Procedure: COLONOSCOPY WITH PROPOFOL;  Surgeon: Earline Mayotte, MD;  Location: [facility] ENDOSCOPY;  Service: Endoscopy;  Laterality: N/A;   EXTRACORPOREAL SHOCK WAVE LITHOTRIPSY Left 09/20/2020   Procedure: EXTRACORPOREAL SHOCK WAVE LITHOTRIPSY (ESWL);  Surgeon: Malen Gauze, MD;  Location: [facility];  Service: Urology;  Laterality: Left;   HERNIA REPAIR  1986, 2005   umbilical    KNEE SURGERY      Home Medications:  Allergies as of 06/21/2021       Reactions   Methylprednisolone Hives   ALLERGY TO STEROID DOSE PACK Other reaction(s): Other (See Comments), Other (See Comments), Other (See Comments) ALLERGY TO STEROID DOSE PACK While taking dose pack any time water (rain, shower water) would contact skin, it felt like needles. ALLERGY TO STEROID DOSE PACK While taking dose pack any time water (rain, shower water) would contact skin, it felt like needles. ALLERGY TO STEROID DOSE PACK ALLERGY TO STEROID DOSE PACK While taking dose pack any time water (rain, shower water) would contact skin, it felt like needles. ALLERGY TO STEROID DOSE PACK ALLERGY TO STEROID DOSE PACK While taking dose pack any time water (rain, shower water)  would contact skin, it felt like needles. While taking dose pack any time water (rain, shower water) would contact skin, it felt like needles. ALLERGY TO STEROID DOSE PACK   Azithromycin    Other reaction(s): Other (See Comments), Other (See Comments) Sensitive skin Sensitive skin        Medication List        Accurate as of June 21, 2021  2:35 PM. If you have any questions, ask your nurse or doctor.          acetaminophen 650 MG CR tablet Commonly known as: TYLENOL Take 650 mg by mouth every 8 (eight) hours as needed for pain.   cetirizine 10 MG tablet Commonly known as: ZYRTEC Take 10 mg by mouth daily.   ibuprofen 800 MG tablet Commonly known as: ADVIL Take 800 mg by mouth every 8 (eight) hours as needed.   naproxen 500 MG tablet Commonly known as: NAPROSYN Take 1 tablet (500 mg total) by mouth 2 (two) times daily.   ondansetron 4 MG disintegrating tablet Commonly known as: ZOFRAN-ODT Take by mouth.   ondansetron 4 MG tablet Commonly known as: Zofran Take 1 tablet (4 mg total) by mouth daily as needed for nausea or vomiting.   oxyCODONE 5 MG immediate release tablet Commonly known as: Roxicodone Take 1 tablet (5 mg total) by mouth every 4 (four) hours as needed for severe pain.   tamsulosin 0.4 MG Caps capsule Commonly known as: Flomax Take 1 capsule (0.4 mg total) by mouth daily.  Allergies:  Allergies  Allergen Reactions   Methylprednisolone Hives    ALLERGY TO STEROID DOSE PACK Other reaction(s): Other (See Comments), Other (See Comments), Other (See Comments) ALLERGY TO STEROID DOSE PACK While taking dose pack any time water (rain, shower water) would contact skin, it felt like needles. ALLERGY TO STEROID DOSE PACK While taking dose pack any time water (rain, shower water) would contact skin, it felt like needles. ALLERGY TO STEROID DOSE PACK ALLERGY TO STEROID DOSE PACK While taking dose pack any time water (rain, shower water)  would contact skin, it felt like needles. ALLERGY TO STEROID DOSE PACK ALLERGY TO STEROID DOSE PACK While taking dose pack any time water (rain, shower water) would contact skin, it felt like needles. While taking dose pack any time water (rain, shower water) would contact skin, it felt like needles. ALLERGY TO STEROID DOSE PACK    Azithromycin     Other reaction(s): Other (See Comments), Other (See Comments) Sensitive skin Sensitive skin     Family History: Family History  Problem Relation Age of Onset   Colon cancer Mother    Hyperlipidemia Mother    Diabetes Mother        borderline   Lung cancer Father     Social History:  reports that he has never smoked. He has never used smokeless tobacco. He reports current alcohol use. He reports that he does not use drugs.  ROS: All other review of systems were reviewed and are negative except what is noted above in HPI  Physical Exam: BP 124/65    Pulse 87   Constitutional:  Alert and oriented, No acute distress. HEENT: [place] AT, moist mucus membranes.  Trachea midline, no masses. Cardiovascular: No clubbing, cyanosis, or edema. Respiratory: Normal respiratory effort, no increased work of breathing. GI: Abdomen is soft, nontender, nondistended, no abdominal masses GU: No CVA tenderness.  Lymph: No cervical or inguinal lymphadenopathy. Skin: No rashes, bruises or suspicious lesions. Neurologic: Grossly intact, no focal deficits, moving all 4 extremities. Psychiatric: Normal mood and affect.  Laboratory Data: Lab Results  Component Value Date   WBC 6.2 09/14/2020   HGB 17.0 09/14/2020   HCT 49.0 09/14/2020   MCV 89.4 09/14/2020   PLT 185 09/14/2020    Lab Results  Component Value Date   CREATININE 0.91 09/14/2020    No results found for: PSA  No results found for: TESTOSTERONE  No results found for: HGBA1C  Urinalysis    Component Value Date/Time   COLORURINE YELLOW 09/14/2020 0445   APPEARANCEUR Clear  09/16/2020 1112   LABSPEC 1.012 09/14/2020 0445   PHURINE 6.0 09/14/2020 0445   GLUCOSEU Negative 09/16/2020 1112   HGBUR LARGE (A) 09/14/2020 0445   BILIRUBINUR Negative 09/16/2020 1112   KETONESUR NEGATIVE 09/14/2020 0445   PROTEINUR Negative 09/16/2020 1112   PROTEINUR NEGATIVE 09/14/2020 0445   NITRITE Negative 09/16/2020 1112   NITRITE NEGATIVE 09/14/2020 0445   LEUKOCYTESUR Trace (A) 09/16/2020 1112   LEUKOCYTESUR NEGATIVE 09/14/2020 0445    Lab Results  Component Value Date   LABMICR See below: 09/16/2020   WBCUA 0-5 09/16/2020   LABEPIT None seen 09/16/2020   MUCUS Present 09/16/2020   BACTERIA Few 09/16/2020    Pertinent Imaging: KUB today: images reviewed and discussed with the patient Results for orders placed during the hospital encounter of 12/06/20  DG Abd 1 View  Narrative CLINICAL DATA:  Nephrolithiasis, history of kidney stones  EXAM: ABDOMEN - 1 VIEW  COMPARISON:  None.  FINDINGS: °Nonobstructive pattern of bowel gas. Suspect an elongated calculus °projecting along the mid left ureter at the left aspect of L3 °measuring 1.1 cm. ° °IMPRESSION: °Suspect an elongated calculus projecting along the mid left ureter °at the left aspect of L3 measuring 1.1 cm. This appears to have °migrated distally in comparison to prior examination dated °10/04/2020. No additional calculi identified. ° ° °Electronically Signed °By: Alex  Bibbey M.D. °On: 12/07/2020 14:51 ° °No results found for this or any previous visit. ° °No results found for this or any previous visit. ° °No results found for this or any previous visit. ° °No results found for this or any previous visit. ° °No results found for this or any previous visit. ° °No results found for this or any previous visit. ° °Results for orders placed during the hospital encounter of 09/14/20 ° °CT RENAL STONE STUDY ° °Narrative °CLINICAL DATA:  Flank pain on the left with kidney stone suspected ° °EXAM: °CT ABDOMEN AND PELVIS  WITHOUT CONTRAST ° °TECHNIQUE: °Multidetector CT imaging of the abdomen and pelvis was performed °following the standard protocol without IV contrast. ° °COMPARISON:  None. ° °FINDINGS: °Lower chest:  Negative ° °Hepatobiliary: Hepatic steatosis.No evidence of biliary obstruction °or stone. ° °Pancreas: Unremarkable. ° °Spleen: Unremarkable. ° °Adrenals/Urinary Tract: Negative adrenals. Mild left hydronephrosis, °periureteric stranding, and low-density renal expansion from a UPJ °stone which measures 10 x 5 mm. 3 mm stone at the lower pole right °kidney. Unremarkable bladder. ° °Stomach/Bowel:  No obstruction. No appendicitis. ° °Vascular/Lymphatic: No acute vascular abnormality. No mass or °adenopathy. ° °Reproductive:No pathologic findings. ° °Other: No ascites or pneumoperitoneum. Shallow fatty right inguinal °hernia. ° °Musculoskeletal: No acute abnormalities. ° °IMPRESSION: °1. Left urinary obstruction from a 10 x 5 mm UPJ calculus. °2. Small right renal calculus. °3. Hepatic steatosis. ° ° °Electronically Signed °By: Jonathon  Watts M.D. °On: 09/14/2020 05:24 ° ° °Assessment & Plan:   ° °1. Kidney stones °-We discussed the management of kidney stones. These options include observation, ureteroscopy, shockwave lithotripsy (ESWL) and percutaneous nephrolithotomy (PCNL). We discussed which options are relevant to the patient's stone(s). We discussed the natural history of kidney stones as well as the complications of untreated stones and the impact on quality of life without treatment as well as with each of the above listed treatments. We also discussed the efficacy of each treatment in its ability to clear the stone burden. With any of these management options I discussed the signs and symptoms of infection and the need for emergent treatment should these be experienced. For each option we discussed the ability of each procedure to clear the patient of their stone burden.  ° °For observation I described the  risks which include but are not limited to silent renal damage, life-threatening infection, need for emergent surgery, failure to pass stone and pain.  ° °For ureteroscopy I described the risks which include bleeding, infection, damage to contiguous structures, positioning injury, ureteral stricture, ureteral avulsion, ureteral injury, need for prolonged ureteral stent, inability to perform ureteroscopy, need for an interval procedure, inability to clear stone burden, stent discomfort/pain, heart attack, stroke, pulmonary embolus and the inherent risks with general anesthesia.  ° °For shockwave lithotripsy I described the risks which include arrhythmia, kidney contusion, kidney hemorrhage, need for transfusion, pain, inability to adequately break up stone, inability to pass stone fragments, [place], infection associated with obstructing stones, need for alternate surgical procedure, need for repeat shockwave lithotripsy, MI, CVA, PE and the inherent risks   with anesthesia/conscious sedation.  ° °For PCNL I described the risks including positioning injury, pneumothorax, hydrothorax, need for chest tube, inability to clear stone burden, renal laceration, arterial venous fistula or malformation, need for embolization of kidney, loss of kidney or renal function, need for repeat procedure, need for prolonged nephrostomy tube, ureteral avulsion, MI, CVA, PE and the inherent risks of general anesthesia.  ° °- The patient would like to proceed with left ESWL °- DG Abd 1 View ° ° °No follow-ups on file. ° °Raynette Arras, MD ° °[facility] °  °

## 2021-06-22 ENCOUNTER — Encounter (HOSPITAL_COMMUNITY): Payer: Self-pay

## 2021-06-23 ENCOUNTER — Other Ambulatory Visit: Payer: Self-pay

## 2021-06-23 ENCOUNTER — Encounter (HOSPITAL_COMMUNITY): Payer: Self-pay

## 2021-06-23 ENCOUNTER — Encounter (HOSPITAL_COMMUNITY)
Admission: RE | Admit: 2021-06-23 | Discharge: 2021-06-23 | Disposition: A | Discharge status: Home / Self Care | Payer: BC Managed Care – PPO | Source: Ambulatory Visit | Attending: Urology | Admitting: Urology

## 2021-06-23 HISTORY — DX: Nausea with vomiting, unspecified: Z98.890

## 2021-06-23 HISTORY — DX: Cardiac arrhythmia, unspecified: I49.9

## 2021-06-23 HISTORY — DX: Nausea with vomiting, unspecified: R11.2

## 2021-06-27 ENCOUNTER — Ambulatory Visit (HOSPITAL_COMMUNITY)
Admission: RE | Admit: 2021-06-27 | Discharge: 2021-06-27 | Disposition: A | Discharge status: Home / Self Care | Payer: BC Managed Care – PPO | Source: Ambulatory Visit | Attending: Emergency Medicine | Admitting: Emergency Medicine

## 2021-06-27 ENCOUNTER — Ambulatory Visit (HOSPITAL_COMMUNITY): Payer: BC Managed Care – PPO

## 2021-06-27 ENCOUNTER — Other Ambulatory Visit: Payer: Self-pay

## 2021-06-27 ENCOUNTER — Encounter (HOSPITAL_COMMUNITY): Payer: Self-pay | Admitting: Urology

## 2021-06-27 ENCOUNTER — Encounter (HOSPITAL_COMMUNITY)
Admission: RE | Disposition: A | Discharge status: Home / Self Care | Payer: Self-pay | Source: Ambulatory Visit | Attending: Emergency Medicine

## 2021-06-27 DIAGNOSIS — N201 Calculus of ureter: Secondary | ICD-10-CM | POA: Insufficient documentation

## 2021-06-27 DIAGNOSIS — I4891 Unspecified atrial fibrillation: Secondary | ICD-10-CM | POA: Diagnosis not present

## 2021-06-27 DIAGNOSIS — Z87442 Personal history of urinary calculi: Secondary | ICD-10-CM | POA: Diagnosis not present

## 2021-06-27 DIAGNOSIS — N200 Calculus of kidney: Secondary | ICD-10-CM | POA: Diagnosis present

## 2021-06-27 DIAGNOSIS — Z9889 Other specified postprocedural states: Secondary | ICD-10-CM

## 2021-06-27 HISTORY — PX: EXTRACORPOREAL SHOCK WAVE LITHOTRIPSY: SHX1557

## 2021-06-27 LAB — CBC WITH DIFFERENTIAL/PLATELET
Abs Immature Granulocytes: 0.02 10*3/uL (ref 0.00–0.07)
Basophils Absolute: 0.1 10*3/uL (ref 0.0–0.1)
Basophils Relative: 1 %
Eosinophils Absolute: 0.1 10*3/uL (ref 0.0–0.5)
Eosinophils Relative: 1 %
HCT: 49.1 % (ref 39.0–52.0)
Hemoglobin: 16.4 g/dL (ref 13.0–17.0)
Immature Granulocytes: 0 %
Lymphocytes Relative: 38 %
Lymphs Abs: 2.5 10*3/uL (ref 0.7–4.0)
MCH: 30.7 pg (ref 26.0–34.0)
MCHC: 33.4 g/dL (ref 30.0–36.0)
MCV: 91.9 fL (ref 80.0–100.0)
Monocytes Absolute: 0.7 10*3/uL (ref 0.1–1.0)
Monocytes Relative: 11 %
Neutro Abs: 3.3 10*3/uL (ref 1.7–7.7)
Neutrophils Relative %: 49 %
Platelets: 196 10*3/uL (ref 150–400)
RBC: 5.34 MIL/uL (ref 4.22–5.81)
RDW: 12 % (ref 11.5–15.5)
WBC: 6.7 10*3/uL (ref 4.0–10.5)
nRBC: 0 % (ref 0.0–0.2)

## 2021-06-27 LAB — BASIC METABOLIC PANEL
Anion gap: 7 (ref 5–15)
BUN: 15 mg/dL (ref 6–20)
CO2: 26 mmol/L (ref 22–32)
Calcium: 8.4 mg/dL — ABNORMAL LOW (ref 8.9–10.3)
Chloride: 106 mmol/L (ref 98–111)
Creatinine, Ser: 1.11 mg/dL (ref 0.61–1.24)
GFR, Estimated: >60 mL/min (ref >60)
Glucose, Bld: 159 mg/dL — ABNORMAL HIGH (ref 70–99)
Potassium: 4.2 mmol/L (ref 3.5–5.1)
Sodium: 139 mmol/L (ref 135–145)

## 2021-06-27 LAB — MAGNESIUM: Magnesium: 2.5 mg/dL — ABNORMAL HIGH (ref 1.7–2.4)

## 2021-06-27 LAB — CBG MONITORING, ED: Glucose-Capillary: 148 mg/dL — ABNORMAL HIGH (ref 70–99)

## 2021-06-27 SURGERY — LITHOTRIPSY, ESWL
Anesthesia: LOCAL | Laterality: Left

## 2021-06-27 MED ORDER — DIAZEPAM 5 MG PO TABS
10.0000 mg | ORAL_TABLET | Freq: Once | ORAL | Status: AC
  Administered 2021-06-27: 10 mg via ORAL
  Filled 2021-06-27: qty 2

## 2021-06-27 MED ORDER — SODIUM CHLORIDE 0.9 % IV SOLN: INTRAVENOUS | Status: DC

## 2021-06-27 MED ORDER — SODIUM CHLORIDE 0.9 % IV BOLUS
1000.0000 mL | Freq: Once | INTRAVENOUS | Status: AC
  Administered 2021-06-27: 1000 mL via INTRAVENOUS

## 2021-06-27 MED ORDER — DIPHENHYDRAMINE HCL 25 MG PO CAPS
25.0000 mg | ORAL_CAPSULE | ORAL | Status: AC
  Administered 2021-06-27: 25 mg via ORAL
  Filled 2021-06-27: qty 1

## 2021-06-27 NOTE — ED Triage Notes (Signed)
Pt brought over from the lithotripsy truck after his sats started dropping at 0930. He became apneic and wouldn't arouse. He appeared dusky and put on 10L face mask with minimal improvement. They placed a nasal trumpet on left side. They used BVM and gave Narcan 0.4mg  IV. After 1 min he woke up and took a big breath. Narcan 0.4 mg given 5 mins later. Pt drowsy but would respond and answer questions. Pt received 3000 shocks during his lithotripsy. He received 3mg  Versed, Fentanyl , Zofran 4mg  during procedure.  Pt arrives to ED drowsy, but responsive to verbal stimuli.

## 2021-06-27 NOTE — ED Notes (Signed)
Pt states feeling much better.  Sitting on edge of bed, no dizziness, no shortness of breath

## 2021-06-27 NOTE — ED Notes (Signed)
Pt's belongings in locker at short stay/Day hospital

## 2021-06-27 NOTE — Discharge Instructions (Addendum)
You have been seen and discharged from the emergency department.  Your blood work is reassuring.  Your reaction could have been a side effect from the medications that were given.  Stay well-hydrated.  Follow-up with your primary provider for further evaluation and further care. Take home medications as prescribed. If you have any worsening symptoms or further concerns for your health please return to an emergency department for further evaluation.

## 2021-06-27 NOTE — ED Notes (Signed)
Pt awake, alert, answering questions and following commands appropriately, family at bedside

## 2021-06-27 NOTE — Interval H&P Note (Signed)
History and Physical Interval Note:  06/27/2021 8:31 AM  Michael Jenkins  has presented today for surgery, with the diagnosis of left ureteral calculus.  The various methods of treatment have been discussed with the patient and family. After consideration of risks, benefits and other options for treatment, the patient has consented to  Procedure(s): EXTRACORPOREAL SHOCK WAVE LITHOTRIPSY (ESWL) (Left) as a surgical intervention.  The patient's history has been reviewed, patient examined, no change in status, stable for surgery.  I have reviewed the patient's chart and labs.  Questions were answered to the patient's satisfaction.     Nicolette Bang

## 2021-06-27 NOTE — ED Notes (Signed)
ED Provider at bedside. 

## 2021-06-27 NOTE — ED Notes (Addendum)
Picked pts belongings up from short stay and gave to pts nurse.

## 2021-06-27 NOTE — ED Notes (Signed)
Nasal trumpet removed by MD.

## 2021-06-27 NOTE — ED Provider Notes (Signed)
[facility] EMERGENCY DEPARTMENT Provider Note   CSN: [id number] Arrival date & time: 06/27/21  [id number]     History  Chief Complaint  Patient presents with   Respiratory Distress    Michael Jenkins is a 36 y.o. male.  HPI  36 year old male with past medical history of kidney stones presents emergency department from procedure with unresponsive episode.  Patient was having a lithotripsy done which he has had before.  Patient was given a combination of Versed and fentanyl for procedure.  At some point during the procedure he became unresponsive and hypoxic.  He required a nasal airway and a dose of Narcan.  There was reported response to the Narcan dose and patient was transported to the ER.  He arrives with nasal airway in place, awake and talking to [place].  He denies any acute complaints at the time but feels generally unwell.  No chest pain, abdominal pain, difficulty breathing.  States has been in his usual state of health the past couple days.  Was prescribed opiates for kidney stone pain but "cannot remember the last time he took it".  Home Medications Prior to Admission medications   Medication Sig Start Date End Date Taking? Authorizing Provider  acetaminophen (TYLENOL) 650 MG CR tablet Take 650 mg by mouth every 8 (eight) hours as needed for pain.   Yes [provider]  cetirizine (ZYRTEC) 10 MG tablet Take 10 mg by mouth daily.   Yes [provider]  ibuprofen (ADVIL) 800 MG tablet Take 800 mg by mouth every 8 (eight) hours as needed for fever, headache or mild pain.   Yes [provider]  naproxen (NAPROSYN) 500 MG tablet Take 1 tablet (500 mg total) by mouth 2 (two) times daily. 09/14/20  Yes Maudie Flakes, MD  ondansetron (ZOFRAN) 4 MG tablet Take 1 tablet (4 mg total) by mouth daily as needed for nausea or vomiting. 01/16/21 01/16/22 Yes McKenzie, Candee Furbish, MD  oxyCODONE (ROXICODONE) 5 MG immediate release tablet Take 1 tablet (5 mg total) by mouth every 4  (four) hours as needed for severe pain. 01/16/21  Yes McKenzie, Candee Furbish, MD  tamsulosin (FLOMAX) 0.4 MG CAPS capsule Take 1 capsule (0.4 mg total) by mouth daily. 06/21/21  Yes McKenzie, Candee Furbish, MD  tamsulosin (FLOMAX) 0.4 MG CAPS capsule Take 1 capsule (0.4 mg total) by mouth daily. Patient not taking: Reported on 06/27/2021 06/21/21   Cleon Gustin, MD      Allergies    Methylprednisolone and Azithromycin    Review of Systems   Review of Systems  Constitutional:  Negative for fever.  Respiratory:  Negative for shortness of breath.   Cardiovascular:  Negative for chest pain.  Gastrointestinal:  Negative for abdominal pain, diarrhea and vomiting.  Musculoskeletal:  Negative for back pain.  Skin:  Negative for rash.  Neurological:  Negative for headaches.   Physical Exam Updated Vital Signs BP 125/72 (BP Location: Right Arm)    Pulse 98    Temp 98.3 F (36.8 C) (Oral)    Resp 13    Ht 5\' 7"  (1.702 m)    Wt 90.7 kg    SpO2 93%    BMI 31.32 kg/m  Physical Exam Vitals and nursing note reviewed.  Constitutional:      Appearance: Normal appearance. He is not diaphoretic.  HENT:     Head: Normocephalic.     Mouth/Throat:     Mouth: Mucous membranes are moist.  Eyes:  Pupils: Pupils are equal, round, and reactive to light.  Cardiovascular:     Rate and Rhythm: Tachycardia present.  Pulmonary:     Effort: Pulmonary effort is normal. No respiratory distress.     Breath sounds: No rales.  Abdominal:     Palpations: Abdomen is soft.     Tenderness: There is no abdominal tenderness.  Skin:    General: Skin is warm.     Coloration: Skin is pale.  Neurological:     Mental Status: He is alert and oriented to person, place, and time. Mental status is at baseline.  Psychiatric:        Mood and Affect: Mood normal.    ED Results / Procedures / Treatments   Labs (all labs ordered are listed, but only abnormal results are displayed) Labs Reviewed  BASIC METABOLIC PANEL -  Abnormal; Notable for the following components:      Result Value   Glucose, Bld 159 (*)    Calcium 8.4 (*)    All other components within normal limits  MAGNESIUM - Abnormal; Notable for the following components:   Magnesium 2.5 (*)    All other components within normal limits  CBG MONITORING, ED - Abnormal; Notable for the following components:   Glucose-Capillary 148 (*)    All other components within normal limits  CBC WITH DIFFERENTIAL/PLATELET    EKG EKG Interpretation  Date/Time:  Tuesday June 27 2021 09:59:52 EST Ventricular Rate:  114 PR Interval:    QRS Duration: 114 QT Interval:  343 QTC Calculation: 473 R Axis:   54 Text Interpretation: Atrial fibrillation Borderline intraventricular conduction delay Baseline wander in lead(s) V1 Seems sinus tachycardia with artifact Confirmed by Coralee Pesa [phone]) on 06/27/2021 10:19:06 AM  Radiology DG Abd 1 View  Result Date: 06/27/2021 CLINICAL DATA:  History of bilateral renal stones. Preoperative examination prior to left-sided lithotripsy. EXAM: ABDOMEN - 1 VIEW COMPARISON:  06/22/2021; 12/06/2020; 09/18/2020; CT abdomen pelvis-09/14/2020 FINDINGS: There is an approximately 0.8 cm calcification overlying the left hemipelvis, again favored to represent a distal left ureteral stone. No definitive abnormal opacities overlies expected location of either renal fossa, the right ureter or the urinary bladder. Paucity of bowel gas without evidence of enteric obstruction. No acute osseous abnormalities. Note is made of a tiny right os acetabuli. IMPRESSION: Approximately 0.8 cm calcification overlying the left hemipelvis favored to represent a distal left ureteral stone. Electronically Signed   By: Simonne Come M.D.   On: 06/27/2021 08:20   DG Chest Portable 1 View  Result Date: 06/27/2021 CLINICAL DATA:  Shortness of breath EXAM: PORTABLE CHEST 1 VIEW COMPARISON:  06/22/2020 FINDINGS: Artifact from EKG leads. Normal heart size and  mediastinal contours. No acute infiltrate or edema. No effusion or pneumothorax. No acute osseous findings. IMPRESSION: No evidence of active disease. Electronically Signed   By: Tiburcio Pea M.D.   On: 06/27/2021 10:31    Procedures Procedures    Medications Ordered in ED Medications  diphenhydrAMINE (BENADRYL) capsule 25 mg (25 mg Oral Given 06/27/21 0758)  diazepam (VALIUM) tablet 10 mg (10 mg Oral Given 06/27/21 0759)  sodium chloride 0.9 % bolus 1,000 mL (1,000 mLs Intravenous New Bag/Given 06/27/21 1016)    ED Course/ Medical Decision Making/ A&P                           Medical Decision Making Amount and/or Complexity of Data Reviewed Labs: ordered. Radiology: ordered.  This patient presents to the ED for concern of somnolence, this involves an extensive number of treatment options, and is a complaint that carries with it a high risk of complications and morbidity.  The differential diagnosis includes oversedation, unresponsiveness, respiratory failure, hypoxia   Additional history obtained: -Additional history obtained from nurses and procedure staff -External records from outside source obtained and reviewed including: Chart review including previous notes, labs, imaging, consultation notes   Lab Tests: -I ordered, reviewed, and interpreted labs.  The pertinent results include: Normal blood sugar, no acute abnormalities   EKG -Sinus tachycardia   Imaging Studies ordered: -I ordered imaging studies including chest x-ray -I independently visualized and interpreted imaging which showed no acute finding -I agree with the radiologist interpretation   Medicines ordered and prescription drug management: -I ordered medication including IV fluids for oversedation/tachycardia -Reevaluation of the patient after these medicines showed that the patient resolved -I have reviewed the patients home medicines and have made adjustments as needed   ED Course: 36 year old  male presents emergency department from the lithotripsy procedure suite after becoming somnolent.  Report is that patient received a combination of Versed, fentanyl and Zofran when he had a period of becoming somnolent with possible hypoxia.  Patient was difficult to arouse.  Placed on a nonrebreather with a nasal airway.  Given Narcan with response.  On arrival patient is slightly sedated but answering questions appropriately, oriented.  Denies any complaints.  No other acute changes to his health yesterday or today.  Denies taking any opiate medication this morning.  Patient was monitored in the department.  Vitals normalized.  He feels well.  He sitting up, conversational, wife is at bedside, tested he is back to baseline.  Is been able to eat and drink and ambulate.  Unclear exactly what happened, could been a combination of the sedative medication but unsure.   Critical Interventions: Rapid response received from procedure suite   Cardiac Monitoring: The patient was maintained on a cardiac monitor.  I personally viewed and interpreted the cardiac monitored which showed an underlying rhythm of: Sinus rhythm   Reevaluation: After the interventions noted above, I reevaluated the patient and found that they have :resolved   Dispostion: Patient at this time appears safe and stable for discharge and close outpatient follow up. Discharge plan and strict return to ED precautions discussed, patient verbalizes understanding and agreement.        Final Clinical Impression(s) / ED Diagnoses Final diagnoses:  Ureteral calculi    Rx / DC Orders ED Discharge Orders     None         Lorelle Gibbs, DO 06/27/21 1427

## 2021-06-28 ENCOUNTER — Encounter (HOSPITAL_COMMUNITY): Payer: Self-pay | Admitting: Urology

## 2021-07-17 ENCOUNTER — Ambulatory Visit
Admission: RE | Admit: 2021-07-17 | Discharge: 2021-07-17 | Disposition: A | Discharge status: Home / Self Care | Payer: No Typology Code available for payment source | Attending: Physician Assistant | Admitting: Physician Assistant

## 2021-07-17 ENCOUNTER — Other Ambulatory Visit: Payer: Self-pay

## 2021-07-17 ENCOUNTER — Ambulatory Visit
Admission: RE | Admit: 2021-07-17 | Discharge: 2021-07-17 | Disposition: A | Discharge status: Home / Self Care | Payer: No Typology Code available for payment source | Source: Ambulatory Visit | Attending: Physician Assistant | Admitting: Physician Assistant

## 2021-07-17 ENCOUNTER — Other Ambulatory Visit: Payer: Self-pay | Admitting: Physician Assistant

## 2021-07-17 DIAGNOSIS — N200 Calculus of kidney: Secondary | ICD-10-CM

## 2021-07-17 DIAGNOSIS — T148XXA Other injury of unspecified body region, initial encounter: Secondary | ICD-10-CM | POA: Insufficient documentation

## 2021-07-18 ENCOUNTER — Other Ambulatory Visit: Payer: Self-pay

## 2021-07-18 ENCOUNTER — Ambulatory Visit (HOSPITAL_COMMUNITY)
Admission: RE | Admit: 2021-07-18 | Discharge: 2021-07-18 | Disposition: A | Discharge status: Home / Self Care | Payer: BC Managed Care – PPO | Source: Ambulatory Visit | Attending: Urology | Admitting: Urology

## 2021-07-18 ENCOUNTER — Ambulatory Visit (INDEPENDENT_AMBULATORY_CARE_PROVIDER_SITE_OTHER): Payer: BC Managed Care – PPO | Admitting: Urology

## 2021-07-18 VITALS — BP 116/74 | HR 75

## 2021-07-18 DIAGNOSIS — N200 Calculus of kidney: Secondary | ICD-10-CM | POA: Diagnosis present

## 2021-07-18 LAB — URINALYSIS, ROUTINE W REFLEX MICROSCOPIC
Bilirubin, UA: NEGATIVE
Glucose, UA: NEGATIVE
Ketones, UA: NEGATIVE
Leukocytes,UA: NEGATIVE
Nitrite, UA: NEGATIVE
Protein,UA: NEGATIVE
Specific Gravity, UA: 1.025 (ref 1.005–1.030)
Urobilinogen, Ur: 0.2 mg/dL (ref 0.2–1.0)
pH, UA: 7 (ref 5.0–7.5)

## 2021-07-18 LAB — MICROSCOPIC EXAMINATION
Bacteria, UA: NONE SEEN
Epithelial Cells (non renal): NONE SEEN /hpf (ref 0–10)
RBC, Urine: NONE SEEN /hpf (ref 0–2)
Renal Epithel, UA: NONE SEEN /hpf
WBC, UA: NONE SEEN /hpf (ref 0–5)

## 2021-07-18 NOTE — Progress Notes (Signed)
? ?07/18/2021 ?11:06 AM  ? ?[name] ?01/24/1986 ?[id number] ? ?Ref[facility] provider: [facility] ?[address] ?[place],  [place] [id number] ? ?Followup nephrolithiasis ? ? ?HPI: ?Mr Michael Jenkins is a 35yo here for followup for nephrolithiasis. He underwent ESWL 2/21. He passed numerous fragments since his ESWL. KUB shows no residual calculi. He denies any flank pain ? ? ?PMH: ?Past Medical History:  ?Diagnosis Date  ? Arrhythmia   ? Chronic back pain   ? Dysrhythmia   ? Irregular heartbeat   ? PONV (postoperative nausea and vomiting)   ? ? ?Surgical History: ?Past Surgical History:  ?Procedure Laterality Date  ? COLONOSCOPY WITH PROPOFOL N/A 08/22/2016  ? Procedure: COLONOSCOPY WITH PROPOFOL;  Surgeon: Earline MayotteJeffrey W Byrnett, MD;  [facility];  Service: Endoscopy;  Laterality: N/A;  ? EXTRACORPOREAL SHOCK WAVE LITHOTRIPSY Left 09/20/2020  ? Procedure: EXTRACORPOREAL SHOCK WAVE LITHOTRIPSY (ESWL);  Surgeon: Malen GauzeMcKenzie, Edenilson Austad L, MD;  Location: [facility];  Service: Urology;  Laterality: Left;  ? EXTRACORPOREAL SHOCK WAVE LITHOTRIPSY Left 06/27/2021  ? Procedure: EXTRACORPOREAL SHOCK WAVE LITHOTRIPSY (ESWL);  Surgeon: Malen GauzeMcKenzie, Ellery Tash L, MD;  Location: [facility];  Service: Urology;  Laterality: Left;  ? HERNIA REPAIR  1986, 2005  ? umbilical   ? KNEE SURGERY Right   ? ? ?Home Medications:  ?Allergies as of 07/18/2021   ? ?   Reactions  ? Methylprednisolone Hives  ? ALLERGY TO STEROID DOSE PACK ?Other reaction(s): Other (See Comments), Other (See Comments), Other (See Comments) ?ALLERGY TO STEROID DOSE PACK ?While taking dose pack any time water (rain, shower water) would contact skin, it felt like needles. ?ALLERGY TO STEROID DOSE PACK ?While taking dose pack any time water (rain, shower water) would contact skin, it felt like needles. ?ALLERGY TO STEROID DOSE PACK ?ALLERGY TO STEROID DOSE PACK ?While taking dose pack any time water (rain, shower water) would contact skin, it felt like needles. ?ALLERGY TO  STEROID DOSE PACK ?ALLERGY TO STEROID DOSE PACK ?While taking dose pack any time water (rain, shower water) would contact skin, it felt like needles. ?While taking dose pack any time water (rain, shower water) would contact skin, it felt like needles. ?ALLERGY TO STEROID DOSE PACK  ? Azithromycin   ? Other reaction(s): Other (See Comments), Other (See Comments) ?Sensitive skin ?Sensitive skin  ? ?  ? ?  ?Medication List  ?  ? ?  ? Accurate as of July 18, 2021 11:06 AM. If you have any questions, ask your nurse or doctor.  ?  ?  ? ?  ? ?acetaminophen 650 MG CR tablet ?Commonly known as: TYLENOL ?Take 650 mg by mouth every 8 (eight) hours as needed for pain. ?  ?cetirizine 10 MG tablet ?Commonly known as: ZYRTEC ?Take 10 mg by mouth daily. ?  ?ibuprofen 800 MG tablet ?Commonly known as: ADVIL ?Take 800 mg by mouth every 8 (eight) hours as needed for fever, headache or mild pain. ?  ?naproxen 500 MG tablet ?Commonly known as: NAPROSYN ?Take 1 tablet (500 mg total) by mouth 2 (two) times daily. ?  ?ondansetron 4 MG tablet ?Commonly known as: Zofran ?Take 1 tablet (4 mg total) by mouth daily as needed for nausea or vomiting. ?  ?oxyCODONE 5 MG immediate release tablet ?Commonly known as: Roxicodone ?Take 1 tablet (5 mg total) by mouth every 4 (four) hours as needed for severe pain. ?  ?tamsulosin 0.4 MG Caps capsule ?Commonly known as: FLOMAX ?Take 1 capsule (0.4 mg total) by  mouth daily. ?  ?tamsulosin 0.4 MG Caps capsule ?Commonly known as: Flomax ?Take 1 capsule (0.4 mg total) by mouth daily. ?  ? ?  ? ? ?Allergies:  ?Allergies  ?Allergen Reactions  ? Methylprednisolone Hives  ?  ALLERGY TO STEROID DOSE PACK ?Other reaction(s): Other (See Comments), Other (See Comments), Other (See Comments) ?ALLERGY TO STEROID DOSE PACK ?While taking dose pack any time water (rain, shower water) would contact skin, it felt like needles. ?ALLERGY TO STEROID DOSE PACK ?While taking dose pack any time water (rain, shower water) would  contact skin, it felt like needles. ?ALLERGY TO STEROID DOSE PACK ?ALLERGY TO STEROID DOSE PACK ?While taking dose pack any time water (rain, shower water) would contact skin, it felt like needles. ?ALLERGY TO STEROID DOSE PACK ?ALLERGY TO STEROID DOSE PACK ?While taking dose pack any time water (rain, shower water) would contact skin, it felt like needles. ?While taking dose pack any time water (rain, shower water) would contact skin, it felt like needles. ?ALLERGY TO STEROID DOSE PACK ?  ? Azithromycin   ?  Other reaction(s): Other (See Comments), Other (See Comments) ?Sensitive skin ?Sensitive skin ?  ? ? ?Family History: ?Family History  ?Problem Relation Age of Onset  ? Colon cancer Mother   ? Hyperlipidemia Mother   ? Diabetes Mother   ?     borderline  ? Lung cancer Father   ? ? ?Social History:  reports that he has never smoked. He has never used smokeless tobacco. He reports current alcohol use. He reports that he does not use drugs. ? ?ROS: ?All other review of systems were reviewed and are negative except what is noted above in HPI ? ?Physical Exam: ?BP 116/74   Pulse 75   ?Constitutional:  Alert and oriented, No acute distress. ?HEENT: [place] AT, moist mucus membranes.  Trachea midline, no masses. ?Cardiovascular: No clubbing, cyanosis, or edema. ?Respiratory: Normal respiratory effort, no increased work of breathing. ?GI: Abdomen is soft, nontender, nondistended, no abdominal masses ?GU: No CVA tenderness.  ?Lymph: No cervical or inguinal lymphadenopathy. ?Skin: No rashes, bruises or suspicious lesions. ?Neurologic: Grossly intact, no focal deficits, moving all 4 extremities. ?Psychiatric: Normal mood and affect. ? ?Laboratory Data: ?Lab Results  ?Component Value Date  ? WBC 6.7 06/27/2021  ? HGB 16.4 06/27/2021  ? HCT 49.1 06/27/2021  ? MCV 91.9 06/27/2021  ? PLT 196 06/27/2021  ? ? ?Lab Results  ?Component Value Date  ? CREATININE 1.11 06/27/2021  ? ? ?No results found for: PSA ? ?No results found for:  TESTOSTERONE ? ?No results found for: HGBA1C ? ?Urinalysis ?   ?Component Value Date/Time  ? COLORURINE YELLOW 09/14/2020 0445  ? APPEARANCEUR Clear 06/21/2021 1542  ? LABSPEC 1.012 09/14/2020 0445  ? PHURINE 6.0 09/14/2020 0445  ? GLUCOSEU Negative 06/21/2021 1542  ? HGBUR LARGE (A) 09/14/2020 0445  ? BILIRUBINUR Negative 06/21/2021 1542  ? KETONESUR NEGATIVE 09/14/2020 0445  ? PROTEINUR Negative 06/21/2021 1542  ? PROTEINUR NEGATIVE 09/14/2020 0445  ? NITRITE Negative 06/21/2021 1542  ? NITRITE NEGATIVE 09/14/2020 0445  ? LEUKOCYTESUR Negative 06/21/2021 1542  ? LEUKOCYTESUR NEGATIVE 09/14/2020 0445  ? ? ?Lab Results  ?Component Value Date  ? LABMICR See below: 06/21/2021  ? WBCUA None seen 06/21/2021  ? LABEPIT 0-10 06/21/2021  ? MUCUS Present 09/16/2020  ? BACTERIA None seen 06/21/2021  ? ? ?Pertinent Imaging: ?KUb today: Images reviewed and discussed with the patient  ?Results for orders placed during the hospital  encounter of 06/27/21 ? ?DG Abd 1 View ? ?Narrative ?CLINICAL DATA:  History of bilateral renal stones. Preoperative ?examination prior to left-sided lithotripsy. ? ?EXAM: ?ABDOMEN - 1 VIEW ? ?COMPARISON:  06/22/2021; 12/06/2020; 09/18/2020; CT abdomen ?pelvis-09/14/2020 ? ?FINDINGS: ?There is an approximately 0.8 cm calcification overlying the left ?hemipelvis, again favored to represent a distal left ureteral stone. ? ?No definitive abnormal opacities overlies expected location of ?either renal fossa, the right ureter or the urinary bladder. ? ?Paucity of bowel gas without evidence of enteric obstruction. ? ?No acute osseous abnormalities. Note is made of a tiny right os ?acetabuli. ? ?IMPRESSION: ?Approximately 0.8 cm calcification overlying the left hemipelvis ?favored to represent a distal left ureteral stone. ? ? ?Electronically Signed ?By: Simonne Come M.D. ?On: 06/27/2021 08:20 ? ?No results found for this or any previous visit. ? ?No results found for this or any previous visit. ? ?No results  found for this or any previous visit. ? ?No results found for this or any previous visit. ? ?No results found for this or any previous visit. ? ?No results found for this or any previous visit. ? ?Results f

## 2021-07-19 ENCOUNTER — Telehealth: Payer: Self-pay | Admitting: Surgery

## 2021-07-19 NOTE — Telephone Encounter (Signed)
Outgoing call is made, patient's name come up on recall list regarding last colonoscopy that was done by  Dr. Lemar Livings 08/22/16.  Patient will call Dr. Rutherford Nail office, he is given number of [phone] to call for follow up of his colonoscopy.   ?

## 2021-07-25 ENCOUNTER — Encounter: Payer: Self-pay | Admitting: Urology

## 2021-07-25 NOTE — Patient Instructions (Signed)
Dietary Guidelines to Help Prevent Kidney Stones Kidney stones are deposits of minerals and salts that form inside your kidneys. Your risk of developing kidney stones may be greater depending on your diet, your lifestyle, the medicines you take, and whether you have certain medical conditions. Most people can lower their chances of developing kidney stones by following the instructions below. Your dietitian may give you more specific instructions depending on your overall health and the type of kidney stones you tend to develop. What are tips for following this plan? Reading food labels  Choose foods with "no salt added" or "low-salt" labels. Limit your salt (sodium) intake to less than 1,500 mg a day. Choose foods with calcium for each meal and snack. Try to eat about 300 mg of calcium at each meal. Foods that contain 200-500 mg of calcium a serving include: 8 oz (237 mL) of milk, calcium-fortifiednon-dairy milk, and calcium-fortifiedfruit juice. Calcium-fortified means that calcium has been added to these drinks. 8 oz (237 mL) of kefir, yogurt, and soy yogurt. 4 oz (114 g) of tofu. 1 oz (28 g) of cheese. 1 cup (150 g) of dried figs. 1 cup (91 g) of cooked broccoli. One 3 oz (85 g) can of sardines or mackerel. Most people need 1,000-1,500 mg of calcium a day. Talk to your dietitian about how much calcium is recommended for you. Shopping Buy plenty of fresh fruits and vegetables. Most people do not need to avoid fruits and vegetables, even if these foods contain nutrients that may contribute to kidney stones. When shopping for convenience foods, choose: Whole pieces of fruit. Pre-made salads with dressing on the side. Low-fat fruit and yogurt smoothies. Avoid buying frozen meals or prepared deli foods. These can be high in sodium. Look for foods with live cultures, such as yogurt and kefir. Choose high-fiber grains, such as whole-wheat breads, oat bran, and wheat cereals. Cooking Do not add  salt to food when cooking. Place a salt shaker on the table and allow each person to add his or her own salt to taste. Use vegetable protein, such as beans, textured vegetable protein (TVP), or tofu, instead of meat in pasta, casseroles, and soups. Meal planning Eat less salt, if told by your dietitian. To do this: Avoid eating processed or pre-made food. Avoid eating fast food. Eat less animal protein, including cheese, meat, poultry, or fish, if told by your dietitian. To do this: Limit the number of times you have meat, poultry, fish, or cheese each week. Eat a diet free of meat at least 2 days a week. Eat only one serving each day of meat, poultry, fish, or seafood. When you prepare animal protein, cut pieces into small portion sizes. For most meat and fish, one serving is about the size of the palm of your hand. Eat at least five servings of fresh fruits and vegetables each day. To do this: Keep fruits and vegetables on hand for snacks. Eat one piece of fruit or a handful of berries with breakfast. Have a salad and fruit at lunch. Have two kinds of vegetables at dinner. Limit foods that are high in a substance called oxalate. These include: Spinach (cooked), rhubarb, beets, sweet potatoes, and Swiss chard. Peanuts. Potato chips, french fries, and baked potatoes with skin on. Nuts and nut products. Chocolate. If you regularly take a diuretic medicine, make sure to eat at least 1 or 2 servings of fruits or vegetables that are high in potassium each day. These include: Avocado. Banana. Orange, prune,   carrot, or tomato juice. Baked potato. Cabbage. Beans and split peas. Lifestyle  Drink enough fluid to keep your urine pale yellow. This is the most important thing you can do. Spread your fluid intake throughout the day. If you drink alcohol: Limit how much you use to: 0-1 drink a day for women who are not pregnant. 0-2 drinks a day for men. Be aware of how much alcohol is in your  drink. In the [place] one drink equals one 12 oz bottle of beer (355 mL), one 5 oz glass of wine (148 mL), or one 1 oz glass of hard liquor (44 mL). Lose weight if told by your health care provider. Work with your dietitian to find an eating plan and weight loss strategies that work best for you. General information Talk to your health care provider and dietitian about taking daily supplements. You may be told the following depending on your health and the cause of your kidney stones: Not to take supplements with vitamin C. To take a calcium supplement. To take a daily probiotic supplement. To take other supplements such as magnesium, fish oil, or vitamin B6. Take over-the-counter and prescription medicines only as told by your health care provider. These include supplements. What foods should I limit? Limit your intake of the following foods, or eat them as told by your dietitian. Vegetables Spinach. Rhubarb. Beets. Canned vegetables. Pickles. Olives. Baked potatoes with skin. Grains Wheat bran. Baked goods. Salted crackers. Cereals high in sugar. Meats and other proteins Nuts. Nut butters. Large portions of meat, poultry, or fish. Salted, precooked, or cured meats, such as sausages, meat loaves, and hot dogs. Dairy Cheese. Beverages Regular soft drinks. Regular vegetable juice. Seasonings and condiments Seasoning blends with salt. Salad dressings. Soy sauce. Ketchup. Barbecue sauce. Other foods Canned soups. Canned pasta sauce. Casseroles. Pizza. Lasagna. Frozen meals. Potato chips. French fries. The items listed above may not be a complete list of foods and beverages you should limit. Contact a dietitian for more information. What foods should I avoid? Talk to your dietitian about specific foods you should avoid based on the type of kidney stones you have and your overall health. Fruits Grapefruit. The item listed above may not be a complete list of foods and beverages you should  avoid. Contact a dietitian for more information. Summary Kidney stones are deposits of minerals and salts that form inside your kidneys. You can lower your risk of kidney stones by making changes to your diet. The most important thing you can do is drink enough fluid. Drink enough fluid to keep your urine pale yellow. Talk to your dietitian about how much calcium you should have each day, and eat less salt and animal protein as told by your dietitian. This information is not intended to replace advice given to you by your health care provider. Make sure you discuss any questions you have with your health care provider. Document Revised: 04/16/2019 Document Reviewed: 04/16/2019 Elsevier Patient Education  2022 Elsevier Inc.  

## 2021-08-18 ENCOUNTER — Ambulatory Visit (HOSPITAL_COMMUNITY): Payer: BC Managed Care – PPO

## 2021-08-29 ENCOUNTER — Ambulatory Visit: Payer: BC Managed Care – PPO | Admitting: Physician Assistant

## 2021-09-07 ENCOUNTER — Ambulatory Visit: Payer: BC Managed Care – PPO | Admitting: Physician Assistant

## 2021-09-11 ENCOUNTER — Ambulatory Visit (HOSPITAL_COMMUNITY)
Admission: RE | Admit: 2021-09-11 | Discharge: 2021-09-11 | Disposition: A | Discharge status: Home / Self Care | Payer: BC Managed Care – PPO | Source: Ambulatory Visit | Attending: Urology | Admitting: Urology

## 2021-09-11 DIAGNOSIS — N200 Calculus of kidney: Secondary | ICD-10-CM | POA: Diagnosis present

## 2021-12-09 ENCOUNTER — Other Ambulatory Visit: Payer: Self-pay

## 2021-12-09 ENCOUNTER — Emergency Department
Admission: EM | Admit: 2021-12-09 | Discharge: 2021-12-09 | Disposition: A | Discharge status: Home / Self Care | Payer: Worker's Compensation | Attending: Emergency Medicine | Admitting: Emergency Medicine

## 2021-12-09 ENCOUNTER — Encounter: Payer: Self-pay | Admitting: *Deleted

## 2021-12-09 DIAGNOSIS — Z041 Encounter for examination and observation following transport accident: Secondary | ICD-10-CM | POA: Diagnosis present

## 2021-12-09 DIAGNOSIS — Y990 Civilian activity done for income or pay: Secondary | ICD-10-CM | POA: Insufficient documentation

## 2021-12-09 DIAGNOSIS — Y92410 Unspecified street and highway as the place of occurrence of the external cause: Secondary | ICD-10-CM | POA: Insufficient documentation

## 2021-12-09 NOTE — ED Notes (Signed)
[place] [facility] DEPARTMENT. WC URINE DRUG SCREEN PERFORMED IN TRIAGE.
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
## 2022-02-15 IMAGING — DX DG ABDOMEN 1V
2 series · 2 of 2 positions shown · non-contrast
Comparison: None.

CLINICAL DATA: Nephrolithiasis, history of kidney stones

EXAM:
ABDOMEN - 1 VIEW

[abdomen kub (1 of 2)]
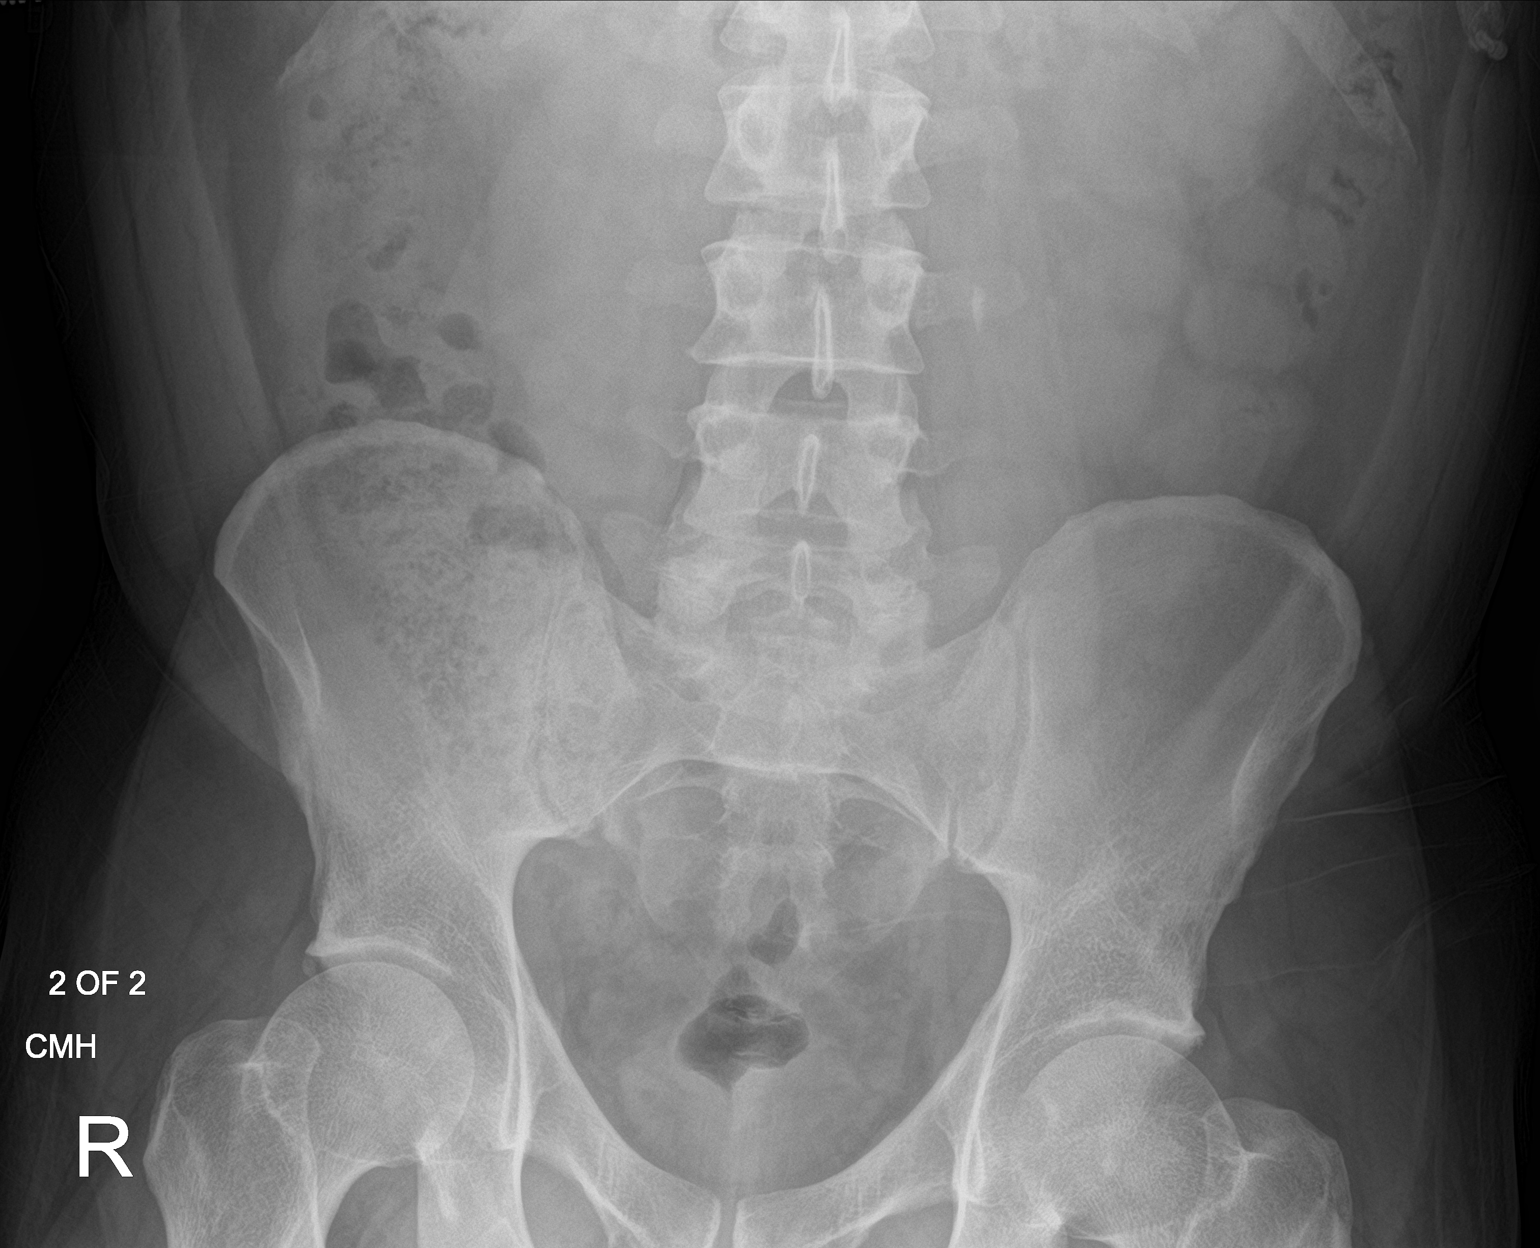

[abdomen kub (2 of 2)]
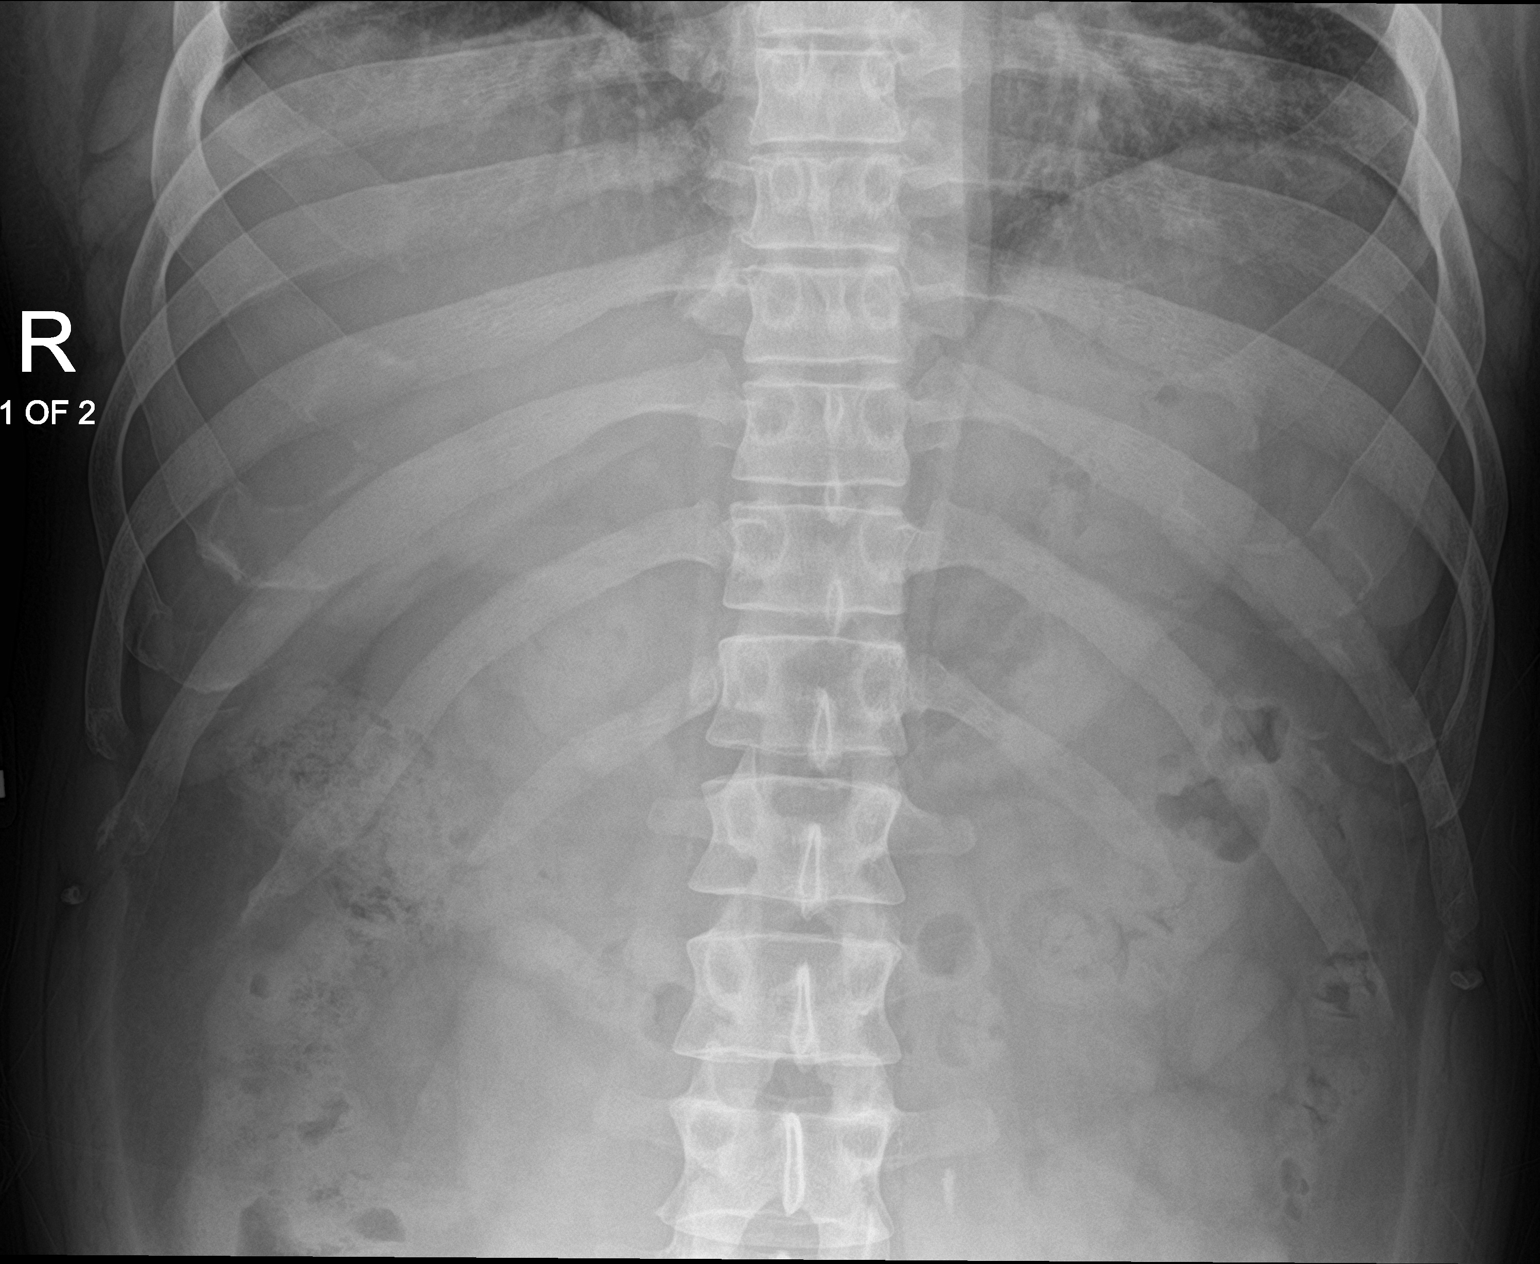

[2 of 2 positions shown; findings below may reference images not displayed]

FINDINGS: Nonobstructive pattern of bowel gas. Suspect an elongated calculus
projecting along the mid left ureter at the left aspect of L3
measuring 1.1 cm.
IMPRESSION: Suspect an elongated calculus projecting along the mid left ureter
at the left aspect of L3 measuring 1.1 cm. This appears to have
migrated distally in comparison to prior examination dated
10/04/2020. No additional calculi identified.

## 2022-07-05 ENCOUNTER — Encounter: Payer: Self-pay | Admitting: Radiology

## 2023-06-18 ENCOUNTER — Encounter: Payer: Self-pay | Admitting: Internal Medicine

## 2023-06-18 ENCOUNTER — Other Ambulatory Visit: Payer: Self-pay | Admitting: Internal Medicine

## 2023-06-18 DIAGNOSIS — N200 Calculus of kidney: Secondary | ICD-10-CM

## 2023-06-19 ENCOUNTER — Inpatient Hospital Stay: Admission: RE | Admit: 2023-06-19 | Payer: BC Managed Care – PPO | Source: Ambulatory Visit

## 2023-06-20 ENCOUNTER — Other Ambulatory Visit: Payer: BC Managed Care – PPO

## 2023-06-20 ENCOUNTER — Ambulatory Visit
Admission: RE | Admit: 2023-06-20 | Discharge: 2023-06-20 | Discharge status: Home / Self Care | Payer: BC Managed Care – PPO | Source: Ambulatory Visit | Attending: Internal Medicine | Admitting: Internal Medicine

## 2023-06-20 DIAGNOSIS — N200 Calculus of kidney: Secondary | ICD-10-CM

## 2023-07-02 ENCOUNTER — Ambulatory Visit: Payer: BC Managed Care – PPO | Admitting: Urology
